# Patient Record
Sex: Male | Born: 1985
Health system: Southern US, Community
[De-identification: ages and names within clinical notes are randomized; demographics above are authoritative.]

---

## 2011-08-04 ENCOUNTER — Other Ambulatory Visit: Payer: Self-pay

## 2011-08-04 ENCOUNTER — Emergency Department (HOSPITAL_BASED_OUTPATIENT_CLINIC_OR_DEPARTMENT_OTHER)
Admission: EM | Admit: 2011-08-04 | Discharge: 2011-08-04 | Disposition: A | Discharge status: Home / Self Care | Payer: BC Managed Care – PPO | Attending: Emergency Medicine | Admitting: Emergency Medicine

## 2011-08-04 ENCOUNTER — Encounter: Payer: Self-pay | Admitting: Family Medicine

## 2011-08-04 ENCOUNTER — Emergency Department (INDEPENDENT_AMBULATORY_CARE_PROVIDER_SITE_OTHER): Payer: BC Managed Care – PPO

## 2011-08-04 DIAGNOSIS — R55 Syncope and collapse: Secondary | ICD-10-CM | POA: Insufficient documentation

## 2011-08-04 DIAGNOSIS — IMO0002 Reserved for concepts with insufficient information to code with codable children: Secondary | ICD-10-CM

## 2011-08-04 DIAGNOSIS — R0602 Shortness of breath: Secondary | ICD-10-CM

## 2011-08-04 DIAGNOSIS — E860 Dehydration: Secondary | ICD-10-CM

## 2011-08-04 DIAGNOSIS — R51 Headache: Secondary | ICD-10-CM

## 2011-08-04 LAB — DIFFERENTIAL
Basophils Relative: 0 % (ref 0–1)
Lymphocytes Relative: 28 % (ref 12–46)
Lymphs Abs: 2.3 10*3/uL (ref 0.7–4.0)
Monocytes Absolute: 0.6 10*3/uL (ref 0.1–1.0)
Monocytes Relative: 8 % (ref 3–12)
Neutro Abs: 5.3 10*3/uL (ref 1.7–7.7)
Neutrophils Relative %: 64 % (ref 43–77)

## 2011-08-04 LAB — URINALYSIS, ROUTINE W REFLEX MICROSCOPIC
Glucose, UA: NEGATIVE mg/dL
Ketones, ur: 40 mg/dL — AB
Leukocytes, UA: NEGATIVE
Nitrite: NEGATIVE
pH: 6 (ref 5.0–8.0)

## 2011-08-04 LAB — CBC
HCT: 44.5 % (ref 39.0–52.0)
Hemoglobin: 15.8 g/dL (ref 13.0–17.0)
MCHC: 35.5 g/dL (ref 30.0–36.0)
RBC: 5.48 MIL/uL (ref 4.22–5.81)

## 2011-08-04 LAB — RAPID URINE DRUG SCREEN, HOSP PERFORMED
Cocaine: NOT DETECTED
Opiates: NOT DETECTED
Tetrahydrocannabinol: NOT DETECTED

## 2011-08-04 LAB — LACTIC ACID, PLASMA: Lactic Acid, Venous: 1.1 mmol/L (ref 0.5–2.2)

## 2011-08-04 LAB — GLUCOSE, CAPILLARY: Glucose-Capillary: 82 mg/dL (ref 70–99)

## 2011-08-04 LAB — COMPREHENSIVE METABOLIC PANEL
Albumin: 5.1 g/dL (ref 3.5–5.2)
Alkaline Phosphatase: 88 U/L (ref 39–117)
BUN: 14 mg/dL (ref 6–23)
CO2: 24 mEq/L (ref 19–32)
Chloride: 100 mEq/L (ref 96–112)
Creatinine, Ser: 0.9 mg/dL (ref 0.50–1.35)
GFR calc Af Amer: >90 mL/min (ref >90)
GFR calc non Af Amer: >90 mL/min (ref >90)
Glucose, Bld: 85 mg/dL (ref 70–99)
Potassium: 3.5 mEq/L (ref 3.5–5.1)
Total Bilirubin: 1 mg/dL (ref 0.3–1.2)

## 2011-08-04 MED ORDER — SODIUM CHLORIDE 0.9 % IV SOLN
999.0000 mL | Freq: Once | INTRAVENOUS | Status: AC
  Administered 2011-08-04: 1000 mL via INTRAVENOUS

## 2011-08-04 NOTE — ED Notes (Signed)
Vital signs stable.Tolerating PO fluids well

## 2011-08-04 NOTE — ED Notes (Signed)
I took cbg reading and got 44 mg/dcltr. I notified nurse of same.

## 2011-08-04 NOTE — ED Notes (Signed)
Per father pt "crawled into house at approx 6pm" and stated that he "passed out in the yard". Per father, pt was initially disoriented. Pt currently alert and oriented. Pt c/o headache. Father sts pt has "not been eating much lately".

## 2011-08-04 NOTE — ED Notes (Signed)
Care plan reviewed with Pt and parent at side

## 2011-08-04 NOTE — ED Provider Notes (Signed)
History     CSN: [id number] Arrival date & time: 08/04/2011  6:52 PM   First MD Initiated Contact with Patient 08/04/11 1951      Chief Complaint  Patient presents with  . Loss of Consciousness    (Consider location/radiation/quality/duration/timing/severity/associated sxs/prior treatment) HPI The patient presents following an episode of loss of consciousness. He notes that prior to several days ago he was in his usual state of health. Over the past several days the patient has been anorexic, with decreased energy. He denies any focal changes during this time period. Today approximately 3 hours prior to presentation the patient had an episode of loss of consciousness with amnesia to the actual events. Upon reading of consciousness, the patient crawled into his house and alert his father that he had lost consciousness. On arrival the patient has no focal pain, no fever, no chills, no confusion, disorientation. He does note that he is fatigued. History reviewed. No pertinent past medical history.  History reviewed. No pertinent past surgical history.  No family history on file.  History  Substance Use Topics  . Smoking status: Never Smoker   . Smokeless tobacco: Not on file  . Alcohol Use: No      Review of Systems  All other systems reviewed and are negative.    Allergies  Penicillins  Home Medications  No current outpatient prescriptions on file.  BP 158/108  Pulse 107  Temp(Src) 98.1 F (36.7 C) (Oral)  Resp 24  SpO2 100%  Physical Exam  Constitutional: He is oriented to person, place, and time. He appears well-developed and well-nourished.  HENT:  Head: Normocephalic and atraumatic.  Eyes: Conjunctivae are normal. Pupils are equal, round, and reactive to light.  Neck: Neck supple.  Cardiovascular: Normal rate and regular rhythm.   Pulmonary/Chest: No respiratory distress.  Abdominal: Soft. There is no tenderness.  Musculoskeletal: He exhibits no edema.    Neurological: He is alert and oriented to person, place, and time.  Skin: Skin is warm and dry.  Psychiatric: He has a normal mood and affect.    ED Course  Procedures (including critical care time)   Labs Reviewed  GLUCOSE, CAPILLARY  CBC  DIFFERENTIAL  COMPREHENSIVE METABOLIC PANEL  URINE RAPID DRUG SCREEN (HOSP PERFORMED)  ETHANOL  LACTIC ACID, PLASMA  URINALYSIS, ROUTINE W REFLEX MICROSCOPIC   Ct Head Wo Contrast  08/04/2011  *RADIOLOGY REPORT*  Clinical Data: Blunt trauma post syncope, headache  CT HEAD WITHOUT CONTRAST  Technique:  Contiguous axial images were obtained from the base of the skull through the vertex without contrast.  Comparison: None.  Findings: There is fluid in the right mastoid air cells. There is no evidence of acute intracranial hemorrhage, brain edema, mass lesion, acute infarction,   mass effect, or midline shift. Acute infarct may be inapparent on noncontrast CT.  No other intra-axial abnormalities are seen, and the ventricles and sulci are within normal limits in size and symmetry.   No abnormal extra-axial fluid collections or masses are identified.  No significant calvarial abnormality.  IMPRESSION: 1. Negative for bleed or other acute intracranial process. 2. Fluid is present in the right mastoid air cells.  Original Report Authenticated By: Osa Craver, M.D.     No diagnosis found.  Results for orders placed during the hospital encounter of 08/04/11  GLUCOSE, CAPILLARY      Component Value Range   Glucose-Capillary 82  70 - 99 (mg/dL)  CBC      Component Value  Range   WBC 8.2  4.0 - 10.5 (K/uL)   RBC 5.48  4.22 - 5.81 (MIL/uL)   Hemoglobin 15.8  13.0 - 17.0 (g/dL)   HCT 91.4  78.2 - 95.6 (%)   MCV 81.2  78.0 - 100.0 (fL)   MCH 28.8  26.0 - 34.0 (pg)   MCHC 35.5  30.0 - 36.0 (g/dL)   RDW 21.3  08.6 - 57.8 (%)   Platelets 238  150 - 400 (K/uL)  DIFFERENTIAL      Component Value Range   Neutrophils Relative 64  43 - 77 (%)    Neutro Abs 5.3  1.7 - 7.7 (K/uL)   Lymphocytes Relative 28  12 - 46 (%)   Lymphs Abs 2.3  0.7 - 4.0 (K/uL)   Monocytes Relative 8  3 - 12 (%)   Monocytes Absolute 0.6  0.1 - 1.0 (K/uL)   Eosinophils Relative 1  0 - 5 (%)   Eosinophils Absolute 0.0  0.0 - 0.7 (K/uL)   Basophils Relative 0  0 - 1 (%)   Basophils Absolute 0.0  0.0 - 0.1 (K/uL)  COMPREHENSIVE METABOLIC PANEL      Component Value Range   Sodium 140  135 - 145 (mEq/L)   Potassium 3.5  3.5 - 5.1 (mEq/L)   Chloride 100  96 - 112 (mEq/L)   CO2 24  19 - 32 (mEq/L)   Glucose, Bld 85  70 - 99 (mg/dL)   BUN 14  6 - 23 (mg/dL)   Creatinine, Ser 4.69  0.50 - 1.35 (mg/dL)   Calcium 62.9  8.4 - 10.5 (mg/dL)   Total Protein 8.4 (*) 6.0 - 8.3 (g/dL)   Albumin 5.1  3.5 - 5.2 (g/dL)   AST 15  0 - 37 (U/L)   ALT 6  0 - 53 (U/L)   Alkaline Phosphatase 88  39 - 117 (U/L)   Total Bilirubin 1.0  0.3 - 1.2 (mg/dL)   GFR calc non Af Amer >90  >90 (mL/min)   GFR calc Af Amer >90  >90 (mL/min)  URINE RAPID DRUG SCREEN (HOSP PERFORMED)      Component Value Range   Opiates NONE DETECTED  NONE DETECTED    Cocaine NONE DETECTED  NONE DETECTED    Benzodiazepines NONE DETECTED  NONE DETECTED    Amphetamines NONE DETECTED  NONE DETECTED    Tetrahydrocannabinol NONE DETECTED  NONE DETECTED    Barbiturates NONE DETECTED  NONE DETECTED   ETHANOL      Component Value Range   Alcohol, Ethyl (B) <11  0 - 11 (mg/dL)  LACTIC ACID, PLASMA      Component Value Range   Lactic Acid, Venous 1.1  0.5 - 2.2 (mmol/L)  URINALYSIS, ROUTINE W REFLEX MICROSCOPIC      Component Value Range   Color, Urine AMBER (*) YELLOW    Appearance CLEAR  CLEAR    Specific Gravity, Urine 1.031 (*) 1.005 - 1.030    pH 6.0  5.0 - 8.0    Glucose, UA NEGATIVE  NEGATIVE (mg/dL)   Hgb urine dipstick NEGATIVE  NEGATIVE    Bilirubin Urine MODERATE (*) NEGATIVE    Ketones, ur 40 (*) NEGATIVE (mg/dL)   Protein, ur NEGATIVE  NEGATIVE (mg/dL)   Urobilinogen, UA 2.0 (*) 0.0 - 1.0  (mg/dL)   Nitrite NEGATIVE  NEGATIVE    Leukocytes, UA NEGATIVE  NEGATIVE    Dg Chest 2 View  08/04/2011  *RADIOLOGY REPORT*  Clinical Data:  Syncope, shortness of breath  CHEST - 2 VIEW  Comparison: None.  Findings: Lungs clear.  Heart size and pulmonary vascularity normal.  No effusion.  Visualized bones unremarkable.  IMPRESSION: No acute disease  Original Report Authenticated By: Osa Craver, M.D.   Ct Head Wo Contrast  08/04/2011  *RADIOLOGY REPORT*  Clinical Data: Blunt trauma post syncope, headache  CT HEAD WITHOUT CONTRAST  Technique:  Contiguous axial images were obtained from the base of the skull through the vertex without contrast.  Comparison: None.  Findings: There is fluid in the right mastoid air cells. There is no evidence of acute intracranial hemorrhage, brain edema, mass lesion, acute infarction,   mass effect, or midline shift. Acute infarct may be inapparent on noncontrast CT.  No other intra-axial abnormalities are seen, and the ventricles and sulci are within normal limits in size and symmetry.   No abnormal extra-axial fluid collections or masses are identified.  No significant calvarial abnormality.  IMPRESSION: 1. Negative for bleed or other acute intracranial process. 2. Fluid is present in the right mastoid air cells.  Original Report Authenticated By: Osa Craver, M.D.    Date: 08/04/2011  Rate:86   Rhythm: normal sinus rhythm  QRS Axis: right  Intervals: normal  ST/T Wave abnormalities: normal  Conduction Disutrbances:none  Narrative Interpretation:   Old EKG Reviewed: none available p wave suggestive of biatrial changes    MDM  This previously well young male presents with several days of fatigue, and one episode of possible loss of consciousness. On exam the patient is in no distress though he appears tired. Laboratory evaluation is notable for urinalysis abnormalities, ketonuria proteinuria, and bilirubin urine. The remainder of the  patient's labs, CAT scan, and x-ray are all within normal limits.  The patient's EKG is abnormal, though nonspecific. Given the absence of any chest pain, or dyspnea, the lack of dysrhythmia is reassuring. Given the absence of fever, the absence of distress, the reassuring labs, though the urine is suggestive of dehydration and catabolic state the patient will be discharged to follow up with his primary care physician. Symptoms suggestive of viral infection.        Gerhard Munch, MD 08/04/11 2220

## 2011-08-04 NOTE — ED Notes (Signed)
Family at bedside.Pt Tolerating PO fluids well

## 2014-03-21 ENCOUNTER — Encounter: Payer: Self-pay | Admitting: Family Medicine

## 2014-03-21 ENCOUNTER — Encounter (INDEPENDENT_AMBULATORY_CARE_PROVIDER_SITE_OTHER): Payer: Self-pay

## 2014-03-21 ENCOUNTER — Ambulatory Visit (INDEPENDENT_AMBULATORY_CARE_PROVIDER_SITE_OTHER): Payer: BC Managed Care – PPO | Admitting: Family Medicine

## 2014-03-21 VITALS — BP 146/99 | HR 77 | Temp 98.3°F | Ht 67.0 in | Wt 143.6 lb

## 2014-03-21 DIAGNOSIS — W57XXXA Bitten or stung by nonvenomous insect and other nonvenomous arthropods, initial encounter: Secondary | ICD-10-CM

## 2014-03-21 DIAGNOSIS — T148 Other injury of unspecified body region: Secondary | ICD-10-CM

## 2014-03-21 MED ORDER — DOXYCYCLINE HYCLATE 100 MG PO TABS: 100.0000 mg | ORAL_TABLET | Freq: Two times a day (BID) | ORAL | Status: DC

## 2014-03-21 NOTE — Progress Notes (Signed)
   Subjective:    Patient ID: Kevin Andrews, male    DOB: 03/23/1986, 28 y.o.   MRN: [id number]  HPI This 28 y.o. male presents for evaluation of tick bite left shoulder.   Review of Systems C/o tick bite   No chest pain, SOB, HA, dizziness, vision change, N/V, diarrhea, constipation, dysuria, urinary urgency or frequency, myalgias, arthralgias or rash.  Objective:   Physical Exam  Left shoulder with 4 cm erythema with central raising approx 1cm diameter       Assessment & Plan:  Tick bite - Plan: doxycycline (VIBRA-TABS) 100 MG tablet Po bid x10 days #20  Kevin Andrews Oxford FNP

## 2014-09-29 ENCOUNTER — Ambulatory Visit: Payer: BC Managed Care – PPO | Admitting: Family Medicine

## 2014-09-30 ENCOUNTER — Emergency Department (HOSPITAL_COMMUNITY): Payer: BLUE CROSS/BLUE SHIELD

## 2014-09-30 ENCOUNTER — Encounter (HOSPITAL_COMMUNITY): Payer: Self-pay | Admitting: Emergency Medicine

## 2014-09-30 ENCOUNTER — Emergency Department (HOSPITAL_COMMUNITY)
Admission: EM | Admit: 2014-09-30 | Discharge: 2014-09-30 | Disposition: A | Discharge status: Home / Self Care | Payer: BLUE CROSS/BLUE SHIELD | Attending: Emergency Medicine | Admitting: Emergency Medicine

## 2014-09-30 DIAGNOSIS — R42 Dizziness and giddiness: Secondary | ICD-10-CM | POA: Insufficient documentation

## 2014-09-30 DIAGNOSIS — Z880 Allergy status to penicillin: Secondary | ICD-10-CM | POA: Insufficient documentation

## 2014-09-30 DIAGNOSIS — I10 Essential (primary) hypertension: Secondary | ICD-10-CM | POA: Insufficient documentation

## 2014-09-30 DIAGNOSIS — Z79899 Other long term (current) drug therapy: Secondary | ICD-10-CM | POA: Diagnosis not present

## 2014-09-30 DIAGNOSIS — I160 Hypertensive urgency: Secondary | ICD-10-CM

## 2014-09-30 LAB — CBC WITH DIFFERENTIAL/PLATELET
BASOS PCT: 0 % (ref 0–1)
Basophils Absolute: 0 10*3/uL (ref 0.0–0.1)
Eosinophils Absolute: 0.1 10*3/uL (ref 0.0–0.7)
Eosinophils Relative: 1 % (ref 0–5)
HEMATOCRIT: 45.1 % (ref 39.0–52.0)
Hemoglobin: 15.7 g/dL (ref 13.0–17.0)
LYMPHS ABS: 1.6 10*3/uL (ref 0.7–4.0)
LYMPHS PCT: 23 % (ref 12–46)
MCH: 29 pg (ref 26.0–34.0)
MCHC: 34.8 g/dL (ref 30.0–36.0)
MCV: 83.2 fL (ref 78.0–100.0)
MONOS PCT: 6 % (ref 3–12)
Monocytes Absolute: 0.4 10*3/uL (ref 0.1–1.0)
NEUTROS ABS: 4.8 10*3/uL (ref 1.7–7.7)
Neutrophils Relative %: 70 % (ref 43–77)
PLATELETS: 200 10*3/uL (ref 150–400)
RBC: 5.42 MIL/uL (ref 4.22–5.81)
RDW: 12.4 % (ref 11.5–15.5)
WBC: 6.9 10*3/uL (ref 4.0–10.5)

## 2014-09-30 LAB — URINALYSIS, ROUTINE W REFLEX MICROSCOPIC
Bilirubin Urine: NEGATIVE
GLUCOSE, UA: NEGATIVE mg/dL
HGB URINE DIPSTICK: NEGATIVE
Ketones, ur: NEGATIVE mg/dL
Leukocytes, UA: NEGATIVE
NITRITE: NEGATIVE
PH: 7.5 (ref 5.0–8.0)
PROTEIN: NEGATIVE mg/dL
SPECIFIC GRAVITY, URINE: 1.012 (ref 1.005–1.030)
Urobilinogen, UA: 1 mg/dL (ref 0.0–1.0)

## 2014-09-30 LAB — COMPREHENSIVE METABOLIC PANEL
ALBUMIN: 4.5 g/dL (ref 3.5–5.2)
ALK PHOS: 97 U/L (ref 39–117)
ALT: 13 U/L (ref 0–53)
AST: 19 U/L (ref 0–37)
Anion gap: 10 (ref 5–15)
BILIRUBIN TOTAL: 0.6 mg/dL (ref 0.3–1.2)
BUN: 12 mg/dL (ref 6–23)
CHLORIDE: 105 meq/L (ref 96–112)
CO2: 24 mmol/L (ref 19–32)
CREATININE: 1.06 mg/dL (ref 0.50–1.35)
Calcium: 9.8 mg/dL (ref 8.4–10.5)
GFR calc Af Amer: >90 mL/min (ref >90)
Glucose, Bld: 107 mg/dL — ABNORMAL HIGH (ref 70–99)
POTASSIUM: 4 mmol/L (ref 3.5–5.1)
Sodium: 139 mmol/L (ref 135–145)
Total Protein: 7.7 g/dL (ref 6.0–8.3)

## 2014-09-30 LAB — CBG MONITORING, ED
Glucose-Capillary: 104 mg/dL — ABNORMAL HIGH (ref 70–99)
Glucose-Capillary: 92 mg/dL (ref 70–99)

## 2014-09-30 MED ORDER — LABETALOL HCL 5 MG/ML IV SOLN
10.0000 mg | Freq: Once | INTRAVENOUS | Status: AC
  Administered 2014-09-30: 10 mg via INTRAVENOUS
  Filled 2014-09-30: qty 4

## 2014-09-30 MED ORDER — METOPROLOL SUCCINATE ER 25 MG PO TB24: 25.0000 mg | ORAL_TABLET | Freq: Every day | ORAL | Status: DC

## 2014-09-30 NOTE — ED Notes (Signed)
Pt CGB 104. Informed Dr. Jeraldine LootsLockwood and RN

## 2014-09-30 NOTE — Discharge Instructions (Signed)
As discussed, it is important that you follow up as soon as possible with your physician for continued management of your condition. ° °If you develop any new, or concerning changes in your condition, please return to the emergency department immediately. ° °

## 2014-09-30 NOTE — ED Notes (Signed)
Pt. reports elevated blood pressure this evening 173/114 with dizziness this morning , denies nausea or vomitting , alert and oriented /speech clear , no facial asymmetry , equal grips with no arm drift.

## 2014-09-30 NOTE — ED Notes (Signed)
Family at bedside. 

## 2014-09-30 NOTE — ED Provider Notes (Signed)
CSN: [id number]     Arrival date & time 09/30/14  2017 History   First MD Initiated Contact with Patient 09/30/14 2039     Chief Complaint  Patient presents with  . Hypertension  . Dizziness     (Consider location/radiation/quality/duration/timing/severity/associated sxs/prior Treatment) HPI Patient presents with concern of dizziness, hypertension. Patient had mild dizziness last night before going to bed, but awoke today, approximately 14 hours ago, with unsteadiness, dizziness. Patient has had no nausea, vomiting, chest pain, dyspnea, speech changes, weakness in any extremity. However, the patient has a mild persistent dizziness. Patient checked his blood pressure several times, found to be elevated. Patient has previously been told of high blood pressure, though has no ongoing medication use.  History reviewed. No pertinent past medical history. History reviewed. No pertinent past surgical history. No family history on file. History  Substance Use Topics  . Smoking status: Never Smoker   . Smokeless tobacco: Not on file  . Alcohol Use: No    Review of Systems  Constitutional:       Per HPI, otherwise negative  HENT:       Per HPI, otherwise negative  Respiratory:       Per HPI, otherwise negative  Cardiovascular:       Per HPI, otherwise negative  Gastrointestinal: Negative for vomiting.  Endocrine:       Negative aside from HPI  Genitourinary:       Neg aside from HPI   Musculoskeletal:       Per HPI, otherwise negative  Skin: Negative.   Neurological: Positive for dizziness and light-headedness. Negative for tremors, seizures, syncope, facial asymmetry, speech difficulty, weakness, numbness and headaches.      Allergies  Penicillins  Home Medications   Prior to Admission medications   Medication Sig Start Date End Date Taking? Authorizing Provider  doxycycline (VIBRA-TABS) 100 MG tablet Take 1 tablet (100 mg total) by mouth 2 (two) times daily. 03/21/14    Anselm PancoastWilliam J Oxford, FNP   BP 171/116 mmHg  Pulse 106  Temp(Src) 98.7 F (37.1 C) (Oral)  Resp 20  SpO2 100% Physical Exam  Constitutional: He is oriented to person, place, and time. He appears well-developed. No distress.  HENT:  Head: Normocephalic and atraumatic.  Eyes: Conjunctivae and EOM are normal.  Cardiovascular: Normal rate and regular rhythm.   Pulmonary/Chest: Effort normal. No stridor. No respiratory distress.  Abdominal: He exhibits no distension.  Musculoskeletal: He exhibits no edema.  Neurological: He is alert and oriented to person, place, and time. He displays no atrophy and no tremor. No cranial nerve deficit or sensory deficit. He exhibits normal muscle tone. He displays no seizure activity. Coordination normal.  Skin: Skin is warm and dry.  Psychiatric: He has a normal mood and affect.  Nursing note and vitals reviewed.   ED Course  Procedures (including critical care time) Labs Review Labs Reviewed  COMPREHENSIVE METABOLIC PANEL - Abnormal; Notable for the following:    Glucose, Bld 107 (*)    All other components within normal limits  URINALYSIS, ROUTINE W REFLEX MICROSCOPIC - Abnormal; Notable for the following:    APPearance CLOUDY (*)    All other components within normal limits  CBG MONITORING, ED - Abnormal; Notable for the following:    Glucose-Capillary 104 (*)    All other components within normal limits  CBC WITH DIFFERENTIAL  CBG MONITORING, ED    Imaging Review Ct Head (brain) Wo Contrast  09/30/2014   CLINICAL DATA:  Dizziness, hypertension.  EXAM: CT HEAD WITHOUT CONTRAST  TECHNIQUE: Contiguous axial images were obtained from the base of the skull through the vertex without intravenous contrast.  COMPARISON:  08/04/2011  FINDINGS: No intracranial hemorrhage, mass effect, or midline shift. No hydrocephalus. The basilar cisterns are patent. No evidence of territorial infarct. No intracranial fluid collection. Calvarium is intact. These mucosal  thickening involving the ethmoid air cells, right greater than left and right frontal sinus. The previous opacification of right mastoid air cells has resolved.  IMPRESSION: 1. No acute intracranial abnormality. 2. Sinusitis.   Electronically Signed   By: Rubye Oaks M.D.   On: 09/30/2014 21:53     EKG Interpretation   Date/Time:  Tuesday September 30 2014 21:05:30 EST Ventricular Rate:  105 PR Interval:  200 QRS Duration: 87 QT Interval:  336 QTC Calculation: 444 R Axis:   92 Text Interpretation:  Age not entered, assumed to be  29 years old for  purpose of ECG interpretation Sinus tachycardia Right atrial enlargement  Borderline right axis deviation Sinus tachycardia Abnormal ekg Confirmed  by Gerhard Munch  MD ([id number]) on 09/30/2014 9:11:48 PM     11:32 PM Patient's symptoms have improved somewhat. Blood pressure has improved substantially, to a near normal range. He, his wife and I had a very lengthy conversation about hypertension, this episode of dizziness, the need for new medication, primary care and neurology follow-up.  MDM   Patient presents with hypertensive urgency as well as dizziness. No nausea, no vomiting, no neurologic deficits on exam.  Patient is awake, alert, interacting appropriately throughout hours of monitoring in the emergency department. Patient's evaluation is largely reassuring, his blood pressure decreases, and his symptoms improved. There is low suspicion for cerebellar infarct, nor other acute ischemic pathology given the absence of distress, the absence of neurologic findings, and the patient's absence of risk factors. However, given the patient's hypertension, at a relatively young age, the patient will follow-up with primary care, neurology for further evaluation and management.    Gerhard Munch, MD 09/30/14 [id number]

## 2014-09-30 NOTE — ED Notes (Signed)
Patient transported to CT 

## 2014-11-03 ENCOUNTER — Telehealth: Payer: Self-pay | Admitting: Family Medicine

## 2014-11-03 MED ORDER — METOPROLOL SUCCINATE ER 25 MG PO TB24: 25.0000 mg | ORAL_TABLET | Freq: Every day | ORAL | Status: DC

## 2014-11-03 NOTE — Telephone Encounter (Signed)
Appt scheduled with Dr Darlyn ReadStacks for 11/10/14. Patient is doing well on Metoprolol. Med refilled.

## 2014-11-10 ENCOUNTER — Telehealth: Payer: Self-pay | Admitting: *Deleted

## 2014-11-10 ENCOUNTER — Ambulatory Visit: Payer: Self-pay | Admitting: Family Medicine

## 2014-12-06 ENCOUNTER — Other Ambulatory Visit: Payer: Self-pay | Admitting: Family Medicine

## 2014-12-08 ENCOUNTER — Other Ambulatory Visit: Payer: Self-pay | Admitting: Family Medicine

## 2014-12-08 MED ORDER — METOPROLOL SUCCINATE ER 25 MG PO TB24: 25.0000 mg | ORAL_TABLET | Freq: Every day | ORAL | Status: DC

## 2014-12-08 NOTE — Telephone Encounter (Signed)
Detailed message left and rx sent to pharmacy

## 2014-12-25 ENCOUNTER — Encounter: Payer: Self-pay | Admitting: Family Medicine

## 2014-12-25 ENCOUNTER — Ambulatory Visit (INDEPENDENT_AMBULATORY_CARE_PROVIDER_SITE_OTHER): Payer: BLUE CROSS/BLUE SHIELD | Admitting: Family Medicine

## 2014-12-25 VITALS — BP 121/81 | HR 68 | Temp 97.4°F | Ht 67.0 in | Wt 149.8 lb

## 2014-12-25 DIAGNOSIS — I10 Essential (primary) hypertension: Secondary | ICD-10-CM | POA: Diagnosis not present

## 2014-12-25 NOTE — Progress Notes (Signed)
Subjective:  Patient ID: Kevin Andrews, male    DOB: [birth year]  Age: 29 y.o. MRN: [id number]  CC: Hypertension and chalazion   HPI Kevin Andrews presents for follow-up of hypertension. Patient has no history of headache chest pain or shortness of breath or recent cough. Patient also denies symptoms of TIA such as numbness weakness lateralizing. Patient checks  blood pressure at home and has not had any elevated readings recently. Patient denies side effects from his medication. States taking it regularly.   History Kevin Andrews has no past medical history on file.   He has no past surgical history on file.   His family history is not on file.He reports that he has never smoked. He does not have any smokeless tobacco history on file. He reports that he does not drink alcohol or use illicit drugs.  Current Outpatient Prescriptions on File Prior to Visit  Medication Sig Dispense Refill  . metoprolol succinate (TOPROL-XL) 25 MG 24 hr tablet Take 1 tablet (25 mg total) by mouth daily. 30 tablet 0   No current facility-administered medications on file prior to visit.    ROS Review of Systems  Constitutional: Negative for fever, chills, diaphoresis and unexpected weight change.  HENT: Negative for congestion, hearing loss, rhinorrhea, sore throat and trouble swallowing.   Respiratory: Negative for cough, chest tightness, shortness of breath and wheezing.   Gastrointestinal: Negative for nausea, vomiting, abdominal pain, diarrhea, constipation and abdominal distention.  Endocrine: Negative for cold intolerance and heat intolerance.  Genitourinary: Negative for dysuria, hematuria and flank pain.  Musculoskeletal: Negative for joint swelling and arthralgias.  Skin: Negative for rash.  Neurological: Negative for dizziness and headaches.  Psychiatric/Behavioral: Negative for dysphoric mood, decreased concentration and agitation. The patient is not nervous/anxious.     Objective:  BP  121/81 mmHg  Pulse 68  Temp(Src) 97.4 F (36.3 C) (Oral)  Ht  (1.702 m)  Wt 149 lb 12.8 oz (67.949 kg)  BMI 23.46 kg/m2  BP Readings from Last 3 Encounters:  12/25/14 121/81  09/30/14 141/94  03/21/14 146/99    Wt Readings from Last 3 Encounters:  12/25/14 149 lb 12.8 oz (67.949 kg)  03/21/14 143 lb 9.6 oz (65.137 kg)     Physical Exam  Constitutional: He is oriented to person, place, and time. He appears well-developed and well-nourished. No distress.  HENT:  Head: Normocephalic and atraumatic.  Right Ear: External ear normal.  Left Ear: External ear normal.  Nose: Nose normal.  Mouth/Throat: Oropharynx is clear and moist.  Eyes: Conjunctivae and EOM are normal. Pupils are equal, round, and reactive to light.  Neck: Normal range of motion. Neck supple. No thyromegaly present.  Cardiovascular: Normal rate, regular rhythm and normal heart sounds.   No murmur heard. Pulmonary/Chest: Effort normal and breath sounds normal. No respiratory distress. He has no wheezes. He has no rales.  Abdominal: Soft. Bowel sounds are normal. He exhibits no distension. There is no tenderness.  Lymphadenopathy:    He has no cervical adenopathy.  Neurological: He is alert and oriented to person, place, and time. He has normal reflexes.  Skin: Skin is warm and dry.  Psychiatric: He has a normal mood and affect. His behavior is normal. Judgment and thought content normal.    No results found for: HGBA1C  Lab Results  Component Value Date   WBC 6.9 09/30/2014   HGB 15.7 09/30/2014   HCT 45.1 09/30/2014   PLT 200 09/30/2014   GLUCOSE 107* 09/30/2014  ALT 13 09/30/2014   AST 19 09/30/2014   NA 139 09/30/2014   K 4.0 09/30/2014   CL 105 09/30/2014   CREATININE 1.06 09/30/2014   BUN 12 09/30/2014   CO2 24 09/30/2014    Ct Head (brain) Wo Contrast  09/30/2014   CLINICAL DATA:  Dizziness, hypertension.  EXAM: CT HEAD WITHOUT CONTRAST  TECHNIQUE: Contiguous axial images were obtained  from the base of the skull through the vertex without intravenous contrast.  COMPARISON:  08/04/2011  FINDINGS: No intracranial hemorrhage, mass effect, or midline shift. No hydrocephalus. The basilar cisterns are patent. No evidence of territorial infarct. No intracranial fluid collection. Calvarium is intact. These mucosal thickening involving the ethmoid air cells, right greater than left and right frontal sinus. The previous opacification of right mastoid air cells has resolved.  IMPRESSION: 1. No acute intracranial abnormality. 2. Sinusitis.   Electronically Signed   By: Rubye OaksMelanie  Ehinger M.D.   On: 09/30/2014 21:53    Assessment & Plan:   Kevin Andrews was seen today for hypertension and chalazion.  Diagnoses and all orders for this visit:  Essential hypertension, benign  I am having Kevin Andrews maintain his metoprolol succinate.  No orders of the defined types were placed in this encounter.     Follow-up: No Follow-up on file.  Kevin Andrews, M.D.

## 2014-12-25 NOTE — Patient Instructions (Signed)
DASH Eating Plan °DASH stands for "Dietary Approaches to Stop Hypertension." The DASH eating plan is a healthy eating plan that has been shown to reduce high blood pressure (hypertension). Additional health benefits may include reducing the risk of type 2 diabetes mellitus, heart disease, and stroke. The DASH eating plan may also help with weight loss. °WHAT DO I NEED TO KNOW ABOUT THE DASH EATING PLAN? °For the DASH eating plan, you will follow these general guidelines: °· Choose foods with a percent daily value for sodium of less than 5% (as listed on the food label). °· Use salt-free seasonings or herbs instead of table salt or sea salt. °· Check with your health care provider or pharmacist before using salt substitutes. °· Eat lower-sodium products, often labeled as "lower sodium" or "no salt added." °· Eat fresh foods. °· Eat more vegetables, fruits, and low-fat dairy products. °· Choose whole grains. Look for the word "whole" as the first word in the ingredient list. °· Choose fish and skinless chicken or turkey more often than red meat. Limit fish, poultry, and meat to 6 oz (170 g) each day. °· Limit sweets, desserts, sugars, and sugary drinks. °· Choose heart-healthy fats. °· Limit cheese to 1 oz (28 g) per day. °· Eat more home-cooked food and less restaurant, buffet, and fast food. °· Limit fried foods. °· Cook foods using methods other than frying. °· Limit canned vegetables. If you do use them, rinse them well to decrease the sodium. °· When eating at a restaurant, ask that your food be prepared with less salt, or no salt if possible. °WHAT FOODS CAN I EAT? °Seek help from a dietitian for individual calorie needs. °Grains °Whole grain or whole wheat bread. Brown rice. Whole grain or whole wheat pasta. Quinoa, bulgur, and whole grain cereals. Low-sodium cereals. Corn or whole wheat flour tortillas. Whole grain cornbread. Whole grain crackers. Low-sodium crackers. °Vegetables °Fresh or frozen vegetables  (raw, steamed, roasted, or grilled). Low-sodium or reduced-sodium tomato and vegetable juices. Low-sodium or reduced-sodium tomato sauce and paste. Low-sodium or reduced-sodium canned vegetables.  °Fruits °All fresh, canned (in natural juice), or frozen fruits. °Meat and Other Protein Products °Ground beef (85% or leaner), grass-fed beef, or beef trimmed of fat. Skinless chicken or turkey. Ground chicken or turkey. Pork trimmed of fat. All fish and seafood. Eggs. Dried beans, peas, or lentils. Unsalted nuts and seeds. Unsalted canned beans. °Dairy °Low-fat dairy products, such as skim or 1% milk, 2% or reduced-fat cheeses, low-fat ricotta or cottage cheese, or plain low-fat yogurt. Low-sodium or reduced-sodium cheeses. °Fats and Oils °Tub margarines without trans fats. Light or reduced-fat mayonnaise and salad dressings (reduced sodium). Avocado. Safflower, olive, or canola oils. Natural peanut or almond butter. °Other °Unsalted popcorn and pretzels. °The items listed above may not be a complete list of recommended foods or beverages. Contact your dietitian for more options. °WHAT FOODS ARE NOT RECOMMENDED? °Grains °White bread. White pasta. White rice. Refined cornbread. Bagels and croissants. Crackers that contain trans fat. °Vegetables °Creamed or fried vegetables. Vegetables in a cheese sauce. Regular canned vegetables. Regular canned tomato sauce and paste. Regular tomato and vegetable juices. °Fruits °Dried fruits. Canned fruit in light or heavy syrup. Fruit juice. °Meat and Other Protein Products °Fatty cuts of meat. Ribs, chicken wings, bacon, sausage, bologna, salami, chitterlings, fatback, hot dogs, bratwurst, and packaged luncheon meats. Salted nuts and seeds. Canned beans with salt. °Dairy °Whole or 2% milk, cream, half-and-half, and cream cheese. Whole-fat or sweetened yogurt. Full-fat   cheeses or blue cheese. Nondairy creamers and whipped toppings. Processed cheese, cheese spreads, or cheese  curds. °Condiments °Onion and garlic salt, seasoned salt, table salt, and sea salt. Canned and packaged gravies. Worcestershire sauce. Tartar sauce. Barbecue sauce. Teriyaki sauce. Soy sauce, including reduced sodium. Steak sauce. Fish sauce. Oyster sauce. Cocktail sauce. Horseradish. Ketchup and mustard. Meat flavorings and tenderizers. Bouillon cubes. Hot sauce. Tabasco sauce. Marinades. Taco seasonings. Relishes. °Fats and Oils °Butter, stick margarine, lard, shortening, ghee, and bacon fat. Coconut, palm kernel, or palm oils. Regular salad dressings. °Other °Pickles and olives. Salted popcorn and pretzels. °The items listed above may not be a complete list of foods and beverages to avoid. Contact your dietitian for more information. °WHERE CAN I FIND MORE INFORMATION? °National [employer], [employer], [employer]: www.nhlbi.nih.gov/health/health-topics/topics/dash/ °Document Released: 09/01/2011 Document Revised: 01/27/2014 Document Reviewed: 07/17/2013 °ExitCare® Patient Information ©2015 [employer]. This information is not intended to replace advice given to you by your health care provider. Make sure you discuss any questions you have with your health care provider. ° °

## 2015-01-10 ENCOUNTER — Other Ambulatory Visit: Payer: Self-pay | Admitting: Family Medicine

## 2015-01-12 ENCOUNTER — Other Ambulatory Visit: Payer: Self-pay | Admitting: Family Medicine

## 2015-01-12 NOTE — Telephone Encounter (Signed)
done

## 2015-04-17 ENCOUNTER — Encounter: Payer: Self-pay | Admitting: *Deleted

## 2015-06-24 ENCOUNTER — Encounter: Payer: BLUE CROSS/BLUE SHIELD | Admitting: Family Medicine

## 2015-06-25 ENCOUNTER — Encounter: Payer: Self-pay | Admitting: Family Medicine

## 2015-06-30 ENCOUNTER — Encounter: Payer: BLUE CROSS/BLUE SHIELD | Admitting: Family Medicine

## 2015-07-21 ENCOUNTER — Other Ambulatory Visit: Payer: Self-pay | Admitting: Family Medicine

## 2015-07-21 ENCOUNTER — Encounter: Payer: Self-pay | Admitting: Family Medicine

## 2015-07-21 ENCOUNTER — Ambulatory Visit (INDEPENDENT_AMBULATORY_CARE_PROVIDER_SITE_OTHER): Payer: BLUE CROSS/BLUE SHIELD | Admitting: Family Medicine

## 2015-07-21 VITALS — BP 131/90 | HR 70 | Temp 97.4°F | Ht 67.0 in | Wt 152.4 lb

## 2015-07-21 DIAGNOSIS — I10 Essential (primary) hypertension: Secondary | ICD-10-CM | POA: Diagnosis not present

## 2015-07-21 DIAGNOSIS — Z0000 Encounter for general adult medical examination without abnormal findings: Secondary | ICD-10-CM

## 2015-07-21 DIAGNOSIS — R1013 Epigastric pain: Secondary | ICD-10-CM

## 2015-07-21 LAB — POCT UA - MICROSCOPIC ONLY
Bacteria, U Microscopic: NEGATIVE
CASTS, UR, LPF, POC: NEGATIVE
Crystals, Ur, HPF, POC: NEGATIVE
Epithelial cells, urine per micros: NEGATIVE
RBC, urine, microscopic: NEGATIVE
WBC, Ur, HPF, POC: NEGATIVE
YEAST UA: NEGATIVE

## 2015-07-21 LAB — POCT URINALYSIS DIPSTICK
BILIRUBIN UA: NEGATIVE
Glucose, UA: NEGATIVE
Ketones, UA: NEGATIVE
Leukocytes, UA: NEGATIVE
Nitrite, UA: NEGATIVE
PH UA: 5
Protein, UA: NEGATIVE
RBC UA: NEGATIVE
SPEC GRAV UA: 1.02
Urobilinogen, UA: NEGATIVE

## 2015-07-21 MED ORDER — PANTOPRAZOLE SODIUM 40 MG PO TBEC: 40.0000 mg | DELAYED_RELEASE_TABLET | Freq: Every day | ORAL | Status: DC

## 2015-07-21 NOTE — Progress Notes (Signed)
Subjective:  Patient ID: Kevin Andrews, male    DOB: 1986/09/20  Age: 29 y.o. MRN: [id number]  CC: Annual Exam   HPI [name] presents for CPE. He reports an episode for 5 months ago where he had a funny feeling in his chest and went to an urgent care and had an EKG and it was normal. The symptoms have not recurred. However he does have to use Tums fairly frequently for indigestion. He is not sure it's indigestion he just knows it's an uncomfortable feeling in his chest but it does go away when he uses Tums almost instantly. He notes that he has some bloating after meals in the evening. He mentions spaghetti and [place] bread and salad last night and had some bloating. That doesn't last very long just a few hours. And it's not every night.  History Hatcher has no past medical history on file.   He has no past surgical history on file.   His family history is not on file.He reports that he has never smoked. He does not have any smokeless tobacco history on file. He reports that he does not drink alcohol or use illicit drugs.  Outpatient Prescriptions Prior to Visit  Medication Sig Dispense Refill  . metoprolol succinate (TOPROL-XL) 25 MG 24 hr tablet TAKE 1 TABLET BY MOUTH DAILY 30 tablet 5   No facility-administered medications prior to visit.    ROS Review of Systems  Constitutional: Negative for fever, chills, diaphoresis, activity change, appetite change, fatigue and unexpected weight change.  HENT: Negative for congestion, ear pain, hearing loss, postnasal drip, rhinorrhea, sore throat, tinnitus and trouble swallowing.   Eyes: Negative for photophobia, pain, discharge and redness.  Respiratory: Negative for apnea, cough, choking, chest tightness, shortness of breath, wheezing and stridor.   Cardiovascular: Negative for chest pain, palpitations and leg swelling.  Gastrointestinal: Positive for abdominal distention. Negative for nausea, vomiting, abdominal pain, diarrhea,  constipation and blood in stool.  Endocrine: Negative for cold intolerance, heat intolerance, polydipsia, polyphagia and polyuria.  Genitourinary: Negative for dysuria, urgency, frequency, hematuria, flank pain, enuresis, difficulty urinating and genital sores.  Musculoskeletal: Negative for joint swelling and arthralgias.  Skin: Negative for color change, rash and wound.  Allergic/Immunologic: Negative for immunocompromised state.  Neurological: Negative for dizziness, tremors, seizures, syncope, facial asymmetry, speech difficulty, weakness, light-headedness, numbness and headaches.  Hematological: Does not bruise/bleed easily.  Psychiatric/Behavioral: Negative for suicidal ideas, hallucinations, behavioral problems, confusion, sleep disturbance, dysphoric mood, decreased concentration and agitation. The patient is not nervous/anxious and is not hyperactive.     Objective:  BP 131/90 mmHg  Pulse 70  Temp(Src) 97.4 F (36.3 C) (Oral)  Ht '5\' 7"'  (1.702 m)  Wt 152 lb 6.4 oz (69.128 kg)  BMI 23.86 kg/m2  BP Readings from Last 3 Encounters:  07/21/15 131/90  12/25/14 121/81  09/30/14 141/94    Wt Readings from Last 3 Encounters:  07/21/15 152 lb 6.4 oz (69.128 kg)  12/25/14 149 lb 12.8 oz (67.949 kg)  03/21/14 143 lb 9.6 oz (65.137 kg)     Physical Exam  Constitutional: He is oriented to person, place, and time. He appears well-developed and well-nourished.  HENT:  Head: Normocephalic and atraumatic.  Mouth/Throat: Oropharynx is clear and moist.  Eyes: EOM are normal. Pupils are equal, round, and reactive to light.  Neck: Normal range of motion. No tracheal deviation present. No thyromegaly present.  Cardiovascular: Normal rate, regular rhythm and normal heart sounds.  Exam reveals no gallop and  no friction rub.   No murmur heard. Pulmonary/Chest: Breath sounds normal. He has no wheezes. He has no rales.  Abdominal: Soft. He exhibits no mass. There is no tenderness.    Musculoskeletal: Normal range of motion. He exhibits no edema.  Neurological: He is alert and oriented to person, place, and time.  Skin: Skin is warm and dry.  Psychiatric: He has a normal mood and affect.    No results found for: HGBA1C  Lab Results  Component Value Date   WBC 6.9 09/30/2014   HGB 15.7 09/30/2014   HCT 45.1 09/30/2014   PLT 200 09/30/2014   GLUCOSE 107* 09/30/2014   ALT 13 09/30/2014   AST 19 09/30/2014   NA 139 09/30/2014   K 4.0 09/30/2014   CL 105 09/30/2014   CREATININE 1.06 09/30/2014   BUN 12 09/30/2014   CO2 24 09/30/2014    Ct Head (brain) Wo Contrast  09/30/2014  CLINICAL DATA:  Dizziness, hypertension. EXAM: CT HEAD WITHOUT CONTRAST TECHNIQUE: Contiguous axial images were obtained from the base of the skull through the vertex without intravenous contrast. COMPARISON:  08/04/2011 FINDINGS: No intracranial hemorrhage, mass effect, or midline shift. No hydrocephalus. The basilar cisterns are patent. No evidence of territorial infarct. No intracranial fluid collection. Calvarium is intact. These mucosal thickening involving the ethmoid air cells, right greater than left and right frontal sinus. The previous opacification of right mastoid air cells has resolved. IMPRESSION: 1. No acute intracranial abnormality. 2. Sinusitis. Electronically Signed   By: Jeb Levering M.D.   On: 09/30/2014 21:53    Assessment & Plan:   Elijan was seen today for annual exam.  Diagnoses and all orders for this visit:  Essential hypertension, benign -     CBC with Differential/Platelet -     CMP14+EGFR -     Lipid panel -     POCT UA - Microscopic Only -     POCT urinalysis dipstick  Wellness examination -     CBC with Differential/Platelet -     CMP14+EGFR -     Lipid panel -     POCT UA - Microscopic Only -     POCT urinalysis dipstick  Dyspepsia  Other orders -     pantoprazole (PROTONIX) 40 MG tablet; Take 1 tablet (40 mg total) by mouth daily. For  stomach   I am having Mr. Sapia start on pantoprazole. I am also having him maintain his metoprolol succinate.  Meds ordered this encounter  Medications  . pantoprazole (PROTONIX) 40 MG tablet    Sig: Take 1 tablet (40 mg total) by mouth daily. For stomach    Dispense:  30 tablet    Refill:  5     Follow-up: Return in about 6 months (around 01/19/2016) for hypertension.  Claretta Fraise, M.D.

## 2015-07-22 ENCOUNTER — Telehealth: Payer: Self-pay | Admitting: Family Medicine

## 2015-07-22 LAB — LIPID PANEL
CHOLESTEROL TOTAL: 154 mg/dL (ref 100–199)
Chol/HDL Ratio: 3.3 ratio units (ref 0.0–5.0)
HDL: 46 mg/dL (ref >39)
LDL Calculated: 93 mg/dL (ref 0–99)
Triglycerides: 73 mg/dL (ref 0–149)
VLDL Cholesterol Cal: 15 mg/dL (ref 5–40)

## 2015-07-22 LAB — CBC WITH DIFFERENTIAL/PLATELET
BASOS: 0 %
Basophils Absolute: 0 10*3/uL (ref 0.0–0.2)
EOS (ABSOLUTE): 0.2 10*3/uL (ref 0.0–0.4)
EOS: 3 %
HEMATOCRIT: 45.1 % (ref 37.5–51.0)
Hemoglobin: 15.3 g/dL (ref 12.6–17.7)
Immature Grans (Abs): 0 10*3/uL (ref 0.0–0.1)
Immature Granulocytes: 0 %
Lymphocytes Absolute: 1.4 10*3/uL (ref 0.7–3.1)
Lymphs: 25 %
MCH: 28.9 pg (ref 26.6–33.0)
MCHC: 33.9 g/dL (ref 31.5–35.7)
MCV: 85 fL (ref 79–97)
Monocytes Absolute: 0.6 10*3/uL (ref 0.1–0.9)
Monocytes: 11 %
NEUTROS ABS: 3.6 10*3/uL (ref 1.4–7.0)
Neutrophils: 61 %
Platelets: 199 10*3/uL (ref 150–379)
RBC: 5.3 x10E6/uL (ref 4.14–5.80)
RDW: 13.6 % (ref 12.3–15.4)
WBC: 5.9 10*3/uL (ref 3.4–10.8)

## 2015-07-22 LAB — CMP14+EGFR
ALBUMIN: 4.5 g/dL (ref 3.5–5.5)
ALK PHOS: 94 IU/L (ref 39–117)
ALT: 12 IU/L (ref 0–44)
AST: 17 IU/L (ref 0–40)
Albumin/Globulin Ratio: 1.8 (ref 1.1–2.5)
BILIRUBIN TOTAL: 0.5 mg/dL (ref 0.0–1.2)
BUN / CREAT RATIO: 11 (ref 8–19)
BUN: 11 mg/dL (ref 6–20)
CHLORIDE: 98 mmol/L (ref 97–106)
CO2: 25 mmol/L (ref 18–29)
Calcium: 9.7 mg/dL (ref 8.7–10.2)
Creatinine, Ser: 1.01 mg/dL (ref 0.76–1.27)
GFR calc non Af Amer: 101 mL/min/{1.73_m2} (ref >59)
GFR, EST AFRICAN AMERICAN: 116 mL/min/{1.73_m2} (ref >59)
GLOBULIN, TOTAL: 2.5 g/dL (ref 1.5–4.5)
Glucose: 86 mg/dL (ref 65–99)
Potassium: 3.9 mmol/L (ref 3.5–5.2)
SODIUM: 140 mmol/L (ref 136–144)
Total Protein: 7 g/dL (ref 6.0–8.5)

## 2015-07-22 NOTE — Telephone Encounter (Signed)
Done this am 

## 2015-10-26 ENCOUNTER — Ambulatory Visit (INDEPENDENT_AMBULATORY_CARE_PROVIDER_SITE_OTHER): Payer: BLUE CROSS/BLUE SHIELD | Admitting: *Deleted

## 2015-10-26 DIAGNOSIS — Z23 Encounter for immunization: Secondary | ICD-10-CM

## 2015-10-26 NOTE — Patient Instructions (Signed)
Tdap Vaccine (Tetanus, Diphtheria and Pertussis): What You Need to Know 1. Why get vaccinated? Tetanus, diphtheria and pertussis are very serious diseases. Tdap vaccine can protect us from these diseases. And, Tdap vaccine given to pregnant women can protect newborn babies against pertussis. TETANUS (Lockjaw) is rare in the [place] today. It causes painful muscle tightening and stiffness, usually all over the body.  It can lead to tightening of muscles in the head and neck so you can't open your mouth, swallow, or sometimes even breathe. Tetanus kills about 1 out of 10 people who are infected even after receiving the best medical care. DIPHTHERIA is also rare in the [place] today. It can cause a thick coating to form in the back of the throat.  It can lead to breathing problems, heart failure, paralysis, and death. PERTUSSIS (Whooping Cough) causes severe coughing spells, which can cause difficulty breathing, vomiting and disturbed sleep.  It can also lead to weight loss, incontinence, and rib fractures. Up to 2 in 100 adolescents and 5 in 100 adults with pertussis are hospitalized or have complications, which could include pneumonia or death. These diseases are caused by bacteria. Diphtheria and pertussis are spread from person to person through secretions from coughing or sneezing. Tetanus enters the body through cuts, scratches, or wounds. Before vaccines, as many as 200,000 cases of diphtheria, 200,000 cases of pertussis, and hundreds of cases of tetanus, were reported in the [place] each year. Since vaccination began, reports of cases for tetanus and diphtheria have dropped by about 99% and for pertussis by about 80%. 2. Tdap vaccine Tdap vaccine can protect adolescents and adults from tetanus, diphtheria, and pertussis. One dose of Tdap is routinely given at age 11 or 12. People who did not get Tdap at that age should get it as soon as possible. Tdap is especially important  for healthcare professionals and anyone having close contact with a baby younger than 12 months. Pregnant women should get a dose of Tdap during every pregnancy, to protect the newborn from pertussis. Infants are most at risk for severe, life-threatening complications from pertussis. Another vaccine, called Td, protects against tetanus and diphtheria, but not pertussis. A Td booster should be given every 10 years. Tdap may be given as one of these boosters if you have never gotten Tdap before. Tdap may also be given after a severe cut or burn to prevent tetanus infection. Your doctor or the person giving you the vaccine can give you more information. Tdap may safely be given at the same time as other vaccines. 3. Some people should not get this vaccine  A person who has ever had a life-threatening allergic reaction after a previous dose of any diphtheria, tetanus or pertussis containing vaccine, OR has a severe allergy to any part of this vaccine, should not get Tdap vaccine. Tell the person giving the vaccine about any severe allergies.  Anyone who had coma or long repeated seizures within 7 days after a childhood dose of DTP or DTaP, or a previous dose of Tdap, should not get Tdap, unless a cause other than the vaccine was found. They can still get Td.  Talk to your doctor if you:  have seizures or another nervous system problem,  had severe pain or swelling after any vaccine containing diphtheria, tetanus or pertussis,  ever had a condition called Guillain-Barr Syndrome (GBS),  aren't feeling well on the day the shot is scheduled. 4. Risks With any medicine, including vaccines, there is   a chance of side effects. These are usually mild and go away on their own. Serious reactions are also possible but are rare. Most people who get Tdap vaccine do not have any problems with it. Mild problems following Tdap (Did not interfere with activities)  Pain where the shot was given (about 3 in 4  adolescents or 2 in 3 adults)  Redness or swelling where the shot was given (about 1 person in 5)  Mild fever of at least 100.4F (up to about 1 in 25 adolescents or 1 in 100 adults)  Headache (about 3 or 4 people in 10)  Tiredness (about 1 person in 3 or 4)  Nausea, vomiting, diarrhea, stomach ache (up to 1 in 4 adolescents or 1 in 10 adults)  Chills, sore joints (about 1 person in 10)  Body aches (about 1 person in 3 or 4)  Rash, swollen glands (uncommon) Moderate problems following Tdap (Interfered with activities, but did not require medical attention)  Pain where the shot was given (up to 1 in 5 or 6)  Redness or swelling where the shot was given (up to about 1 in 16 adolescents or 1 in 12 adults)  Fever over 102F (about 1 in 100 adolescents or 1 in 250 adults)  Headache (about 1 in 7 adolescents or 1 in 10 adults)  Nausea, vomiting, diarrhea, stomach ache (up to 1 or 3 people in 100)  Swelling of the entire arm where the shot was given (up to about 1 in 500). Severe problems following Tdap (Unable to perform usual activities; required medical attention)  Swelling, severe pain, bleeding and redness in the arm where the shot was given (rare). Problems that could happen after any vaccine:  People sometimes faint after a medical procedure, including vaccination. Sitting or lying down for about 15 minutes can help prevent fainting, and injuries caused by a fall. Tell your doctor if you feel dizzy, or have vision changes or ringing in the ears.  Some people get severe pain in the shoulder and have difficulty moving the arm where a shot was given. This happens very rarely.  Any medication can cause a severe allergic reaction. Such reactions from a vaccine are very rare, estimated at fewer than 1 in a million doses, and would happen within a few minutes to a few hours after the vaccination. As with any medicine, there is a very remote chance of a vaccine causing a serious  injury or death. The safety of vaccines is always being monitored. For more information, visit: www.cdc.gov/vaccinesafety/ 5. What if there is a serious problem? What should I look for?  Look for anything that concerns you, such as signs of a severe allergic reaction, very high fever, or unusual behavior.  Signs of a severe allergic reaction can include hives, swelling of the face and throat, difficulty breathing, a fast heartbeat, dizziness, and weakness. These would usually start a few minutes to a few hours after the vaccination. What should I do?  If you think it is a severe allergic reaction or other emergency that can't wait, call 9-1-1 or get the person to the nearest hospital. Otherwise, call your doctor.  Afterward, the reaction should be reported to the Vaccine Adverse Event Reporting System (VAERS). Your doctor might file this report, or you can do it yourself through the VAERS web site at www.vaers.hhs.gov, or by calling 1-800-822-7967. VAERS does not give medical advice.  6. The National Vaccine Injury Compensation Program The National Vaccine Injury Compensation Program (  VICP) is a federal program that was created to compensate people who may have been injured by certain vaccines. Persons who believe they may have been injured by a vaccine can learn about the program and about filing a claim by calling 1-800-338-2382 or visiting the VICP website at www.hrsa.gov/vaccinecompensation. There is a time limit to file a claim for compensation. 7. How can I learn more?  Ask your doctor. He or she can give you the vaccine package insert or suggest other sources of information.  Call your local or state health department.  Contact the Centers for Disease Control and Prevention (CDC):  Call 1-800-232-4636 (1-800-CDC-INFO) or  Visit CDC's website at www.cdc.gov/vaccines CDC Tdap Vaccine VIS (11/19/13)   This information is not intended to replace advice given to you by your health care  provider. Make sure you discuss any questions you have with your health care provider.   Document Released: 03/13/2012 Document Revised: 10/03/2014 Document Reviewed: 12/25/2013 Elsevier Interactive Patient Education 2016 [employer]  

## 2015-10-26 NOTE — Progress Notes (Signed)
Flu vaccine and tdap given and patient tolerated well.

## 2016-01-19 ENCOUNTER — Ambulatory Visit: Payer: BLUE CROSS/BLUE SHIELD | Admitting: Family Medicine

## 2016-01-20 ENCOUNTER — Encounter: Payer: Self-pay | Admitting: Family Medicine

## 2016-01-20 ENCOUNTER — Ambulatory Visit (INDEPENDENT_AMBULATORY_CARE_PROVIDER_SITE_OTHER): Payer: BLUE CROSS/BLUE SHIELD | Admitting: Family Medicine

## 2016-01-20 ENCOUNTER — Encounter (INDEPENDENT_AMBULATORY_CARE_PROVIDER_SITE_OTHER): Payer: Self-pay

## 2016-01-20 VITALS — BP 115/72 | HR 102 | Temp 98.7°F | Ht 67.0 in | Wt 150.2 lb

## 2016-01-20 DIAGNOSIS — J0100 Acute maxillary sinusitis, unspecified: Secondary | ICD-10-CM | POA: Diagnosis not present

## 2016-01-20 MED ORDER — BETAMETHASONE SOD PHOS & ACET 6 (3-3) MG/ML IJ SUSP
6.0000 mg | Freq: Once | INTRAMUSCULAR | Status: AC
  Administered 2016-01-20: 6 mg via INTRAMUSCULAR

## 2016-01-20 MED ORDER — SULFAMETHOXAZOLE-TRIMETHOPRIM 800-160 MG PO TABS: 1.0000 | ORAL_TABLET | Freq: Two times a day (BID) | ORAL | Status: DC

## 2016-01-20 MED ORDER — METOPROLOL SUCCINATE ER 25 MG PO TB24: 25.0000 mg | ORAL_TABLET | Freq: Every day | ORAL | Status: DC

## 2016-01-20 NOTE — Progress Notes (Signed)
Subjective:  Patient ID: Kevin Andrews, male    DOB: 1986/09/13  Age: 30 y.o. MRN: [id number]  CC: Hypertension and Nevus   HPI Kevin Andrews presents for Symptoms include congestion, facial pain, nasal congestion, no  fever, non productive cough, post nasal drip and sinus pressure with no fever, chills, night sweats or weight loss. Onset of symptoms was a few days ago, gradually worsening since that time. Pt.is drinking moderate amounts of fluids.     follow-up of hypertension. Patient has no history of headache chest pain or shortness of breath or recent cough. Patient also denies symptoms of TIA such as numbness weakness lateralizing. Patient checks  blood pressure at home and has not had any elevated readings recently. Patient denies side effects from his medication. States taking it regularly.   Patient also has several moles on his back that he wants checked. There's been no change recently but his wife's concern about potential for cancer because her mother died of cancer. Additionally the patient does tend to have excessive sun exposure from going without a shirt outside in the summertime.  History Kevin Andrews has no past medical history on file.   He has no past surgical history on file.   His family history is not on file.He reports that he has never smoked. He does not have any smokeless tobacco history on file. He reports that he does not drink alcohol or use illicit drugs.    ROS Review of Systems  Constitutional: Negative for fever, chills, diaphoresis, activity change and appetite change.  HENT: Positive for congestion, postnasal drip and sinus pressure. Negative for ear discharge, ear pain, hearing loss, nosebleeds, rhinorrhea, sneezing, sore throat and trouble swallowing.   Respiratory: Negative for cough, chest tightness and shortness of breath.   Cardiovascular: Negative for chest pain and palpitations.  Gastrointestinal: Negative for abdominal pain.    Musculoskeletal: Negative for myalgias and arthralgias.  Skin: Negative for rash.  Neurological: Negative for weakness and headaches.    Objective:  BP 115/72 mmHg  Pulse 102  Temp(Src) 98.7 F (37.1 C) (Oral)  Ht  (1.702 m)  Wt 150 lb 3.2 oz (68.13 kg)  BMI 23.52 kg/m2  SpO2 99%  BP Readings from Last 3 Encounters:  01/20/16 115/72  07/21/15 131/90  12/25/14 121/81    Wt Readings from Last 3 Encounters:  01/20/16 150 lb 3.2 oz (68.13 kg)  07/21/15 152 lb 6.4 oz (69.128 kg)  12/25/14 149 lb 12.8 oz (67.949 kg)     Physical Exam  Constitutional: He is oriented to person, place, and time. He appears well-developed and well-nourished. No distress.  HENT:  Head: Normocephalic and atraumatic.  Right Ear: External ear normal.  Left Ear: External ear normal.  Nose: Nose normal.  Mouth/Throat: Oropharynx is clear and moist.  Eyes: Conjunctivae and EOM are normal. Pupils are equal, round, and reactive to light.  Neck: Normal range of motion. Neck supple. No thyromegaly present.  Cardiovascular: Normal rate, regular rhythm and normal heart sounds.   No murmur heard. Pulmonary/Chest: Effort normal and breath sounds normal. No respiratory distress. He has no wheezes. He has no rales.  Abdominal: Soft. Bowel sounds are normal. He exhibits no distension. There is no tenderness.  Lymphadenopathy:    He has no cervical adenopathy.  Neurological: He is alert and oriented to person, place, and time. He has normal reflexes.  Skin: Skin is warm and dry.  Psychiatric: He has a normal mood and affect. His behavior is normal.  Judgment and thought content normal.     Lab Results  Component Value Date   WBC 5.9 07/21/2015   HGB 15.7 09/30/2014   HCT 45.1 07/21/2015   PLT 199 07/21/2015   GLUCOSE 86 07/21/2015   CHOL 154 07/21/2015   TRIG 73 07/21/2015   HDL 46 07/21/2015   LDLCALC 93 07/21/2015   ALT 12 07/21/2015   AST 17 07/21/2015   NA 140 07/21/2015   K 3.9  07/21/2015   CL 98 07/21/2015   CREATININE 1.01 07/21/2015   BUN 11 07/21/2015   CO2 25 07/21/2015    Ct Head (brain) Wo Contrast  09/30/2014  CLINICAL DATA:  Dizziness, hypertension. EXAM: CT HEAD WITHOUT CONTRAST TECHNIQUE: Contiguous axial images were obtained from the base of the skull through the vertex without intravenous contrast. COMPARISON:  08/04/2011 FINDINGS: No intracranial hemorrhage, mass effect, or midline shift. No hydrocephalus. The basilar cisterns are patent. No evidence of territorial infarct. No intracranial fluid collection. Calvarium is intact. These mucosal thickening involving the ethmoid air cells, right greater than left and right frontal sinus. The previous opacification of right mastoid air cells has resolved. IMPRESSION: 1. No acute intracranial abnormality. 2. Sinusitis. Electronically Signed   By: Rubye OaksMelanie  Ehinger M.D.   On: 09/30/2014 21:53    Assessment & Plan:   Kevin Andrews was seen today for hypertension and nevus.  Diagnoses and all orders for this visit:  Acute maxillary sinusitis, recurrence not specified -     betamethasone acetate-betamethasone sodium phosphate (CELESTONE) injection 6 mg; Inject 1 mL (6 mg total) into the muscle once.  Other orders -     metoprolol succinate (TOPROL-XL) 25 MG 24 hr tablet; Take 1 tablet (25 mg total) by mouth daily. -     sulfamethoxazole-trimethoprim (BACTRIM DS,SEPTRA DS) 800-160 MG tablet; Take 1 tablet by mouth 2 (two) times daily.     I have changed Kevin Andrews's metoprolol succinate. I am also having him start on sulfamethoxazole-trimethoprim. Additionally, I am having him maintain his pantoprazole. We will continue to administer betamethasone acetate-betamethasone sodium phosphate.  Meds ordered this encounter  Medications  . metoprolol succinate (TOPROL-XL) 25 MG 24 hr tablet    Sig: Take 1 tablet (25 mg total) by mouth daily.    Dispense:  30 tablet    Refill:  5  . sulfamethoxazole-trimethoprim  (BACTRIM DS,SEPTRA DS) 800-160 MG tablet    Sig: Take 1 tablet by mouth 2 (two) times daily.    Dispense:  14 tablet    Refill:  0  . betamethasone acetate-betamethasone sodium phosphate (CELESTONE) injection 6 mg    Sig:      Follow-up: No Follow-up on file.  Mechele ClaudeWarren Jennylee Uehara, M.D.

## 2016-01-20 NOTE — Addendum Note (Signed)
Addended by: Bearl MulberryUTHERFORD, NATALIE K on: 01/20/2016 05:13 PM   Modules accepted: Kipp BroodSmartSet

## 2016-07-22 ENCOUNTER — Ambulatory Visit: Payer: BLUE CROSS/BLUE SHIELD | Admitting: Family Medicine

## 2016-09-10 ENCOUNTER — Other Ambulatory Visit: Payer: Self-pay | Admitting: Family Medicine

## 2016-09-10 MED ORDER — METOPROLOL SUCCINATE ER 25 MG PO TB24: 25.0000 mg | ORAL_TABLET | Freq: Every day | ORAL | 0 refills | Status: DC

## 2016-09-10 NOTE — Telephone Encounter (Signed)
Left message that 30 day supply sent to pharmacy but he will need to keep his appt next week. He should call back and speak with on call provider about any concerns about symptoms he's having since being off of his meds for 1 week.

## 2016-09-14 ENCOUNTER — Ambulatory Visit: Payer: BLUE CROSS/BLUE SHIELD | Admitting: Family Medicine

## 2016-09-15 ENCOUNTER — Telehealth: Payer: Self-pay | Admitting: Family Medicine

## 2016-10-08 ENCOUNTER — Other Ambulatory Visit: Payer: Self-pay | Admitting: Family Medicine

## 2016-11-07 ENCOUNTER — Other Ambulatory Visit: Payer: Self-pay | Admitting: Family Medicine

## 2016-12-07 NOTE — Telephone Encounter (Signed)
Left message

## 2017-01-30 ENCOUNTER — Ambulatory Visit (INDEPENDENT_AMBULATORY_CARE_PROVIDER_SITE_OTHER): Payer: Self-pay | Admitting: Family Medicine

## 2017-01-30 ENCOUNTER — Encounter: Payer: Self-pay | Admitting: Family Medicine

## 2017-01-30 VITALS — BP 135/92 | HR 70 | Temp 98.4°F | Ht 67.0 in | Wt 153.0 lb

## 2017-01-30 DIAGNOSIS — N521 Erectile dysfunction due to diseases classified elsewhere: Secondary | ICD-10-CM

## 2017-01-30 DIAGNOSIS — I10 Essential (primary) hypertension: Secondary | ICD-10-CM

## 2017-01-30 MED ORDER — DOXAZOSIN MESYLATE 8 MG PO TABS
8.0000 mg | ORAL_TABLET | Freq: Every day | ORAL | 5 refills | Status: DC
Start: 2017-03-02 — End: 2018-06-28

## 2017-01-30 MED ORDER — DOXAZOSIN MESYLATE 4 MG PO TABS: 4.0000 mg | ORAL_TABLET | Freq: Every day | ORAL | 0 refills | Status: DC

## 2017-01-30 NOTE — Progress Notes (Signed)
Subjective:  Patient ID: Kevin Andrews, male    DOB: 21-Apr-1986  Age: 31 y.o. MRN: [id number]  CC: Hypertension (pt here today for routine follow up on his HTN)   HPI Kevin Andrews presents for  follow-up of hypertension. Patient is taking med regularly.Having occasional E.D. Only occurs when taking med. If he holds the med he can perform normally.   History Kevin Andrews has no past medical history on file.   He has no past surgical history on file.   His family history is not on file.He reports that he has never smoked. He has never used smokeless tobacco. He reports that he does not drink alcohol or use drugs.  No current outpatient prescriptions on file prior to visit.   No current facility-administered medications on file prior to visit.     ROS Review of Systems  Constitutional: Negative for activity change.  Cardiovascular: Negative for chest pain.  Genitourinary: Negative for testicular pain.  Neurological: Negative for dizziness.    Objective:  BP (!) 135/92   Pulse 70   Temp 98.4 F (36.9 C) (Oral)   Ht 5\' 7"  (1.702 m)   Wt 153 lb (69.4 kg)   BMI 23.96 kg/m   BP Readings from Last 3 Encounters:  01/30/17 (!) 135/92  01/20/16 115/72  07/21/15 131/90    Wt Readings from Last 3 Encounters:  01/30/17 153 lb (69.4 kg)  01/20/16 150 lb 3.2 oz (68.1 kg)  07/21/15 152 lb 6.4 oz (69.1 kg)     Physical Exam  Constitutional: He is oriented to person, place, and time. He appears well-developed and well-nourished.  HENT:  Head: Normocephalic and atraumatic.  Right Ear: External ear normal.  Left Ear: External ear normal.  Mouth/Throat: No oropharyngeal exudate or posterior oropharyngeal erythema.  Eyes: Pupils are equal, round, and reactive to light.  Neck: Normal range of motion. Neck supple.  Cardiovascular: Normal rate and regular rhythm.   No murmur heard. Pulmonary/Chest: Breath sounds normal. No respiratory distress.  Neurological: He is alert and  oriented to person, place, and time.  Vitals reviewed.    Lab Results  Component Value Date   WBC 5.9 07/21/2015   HGB 15.7 09/30/2014   HCT 45.1 07/21/2015   PLT 199 07/21/2015   GLUCOSE 86 07/21/2015   CHOL 154 07/21/2015   TRIG 73 07/21/2015   HDL 46 07/21/2015   LDLCALC 93 07/21/2015   ALT 12 07/21/2015   AST 17 07/21/2015   NA 140 07/21/2015   K 3.9 07/21/2015   CL 98 07/21/2015   CREATININE 1.01 07/21/2015   BUN 11 07/21/2015   CO2 25 07/21/2015    Ct Head (brain) Wo Contrast  Result Date: 09/30/2014 CLINICAL DATA:  Dizziness, hypertension. EXAM: CT HEAD WITHOUT CONTRAST TECHNIQUE: Contiguous axial images were obtained from the base of the skull through the vertex without intravenous contrast. COMPARISON:  08/04/2011 FINDINGS: No intracranial hemorrhage, mass effect, or midline shift. No hydrocephalus. The basilar cisterns are patent. No evidence of territorial infarct. No intracranial fluid collection. Calvarium is intact. These mucosal thickening involving the ethmoid air cells, right greater than left and right frontal sinus. The previous opacification of right mastoid air cells has resolved. IMPRESSION: 1. No acute intracranial abnormality. 2. Sinusitis. Electronically Signed   By: Rubye Oaks M.D.   On: 09/30/2014 21:53    Assessment & Plan:   Kevin Andrews was seen today for hypertension.  Diagnoses and all orders for this visit:  Essential hypertension, benign  Erectile  dysfunction due to diseases classified elsewhere  Other orders -     doxazosin (CARDURA) 4 MG tablet; Take 1 tablet (4 mg total) by mouth at bedtime. For blood pressure. -     doxazosin (CARDURA) 8 MG tablet; Take 1 tablet (8 mg total) by mouth at bedtime. For BP. (Starting One month after starting 4 mg dose)   I have discontinued Mr. Elsie StainMarshall's pantoprazole, sulfamethoxazole-trimethoprim, and metoprolol succinate. I am also having him start on doxazosin and doxazosin.  Meds ordered this  encounter  Medications  . doxazosin (CARDURA) 4 MG tablet    Sig: Take 1 tablet (4 mg total) by mouth at bedtime. For blood pressure.    Dispense:  30 tablet    Refill:  0  . doxazosin (CARDURA) 8 MG tablet    Sig: Take 1 tablet (8 mg total) by mouth at bedtime. For BP. (Starting One month after starting 4 mg dose)    Dispense:  30 tablet    Refill:  5      Follow-up: Return in about 6 months (around 08/02/2017) for Wellness.  Mechele ClaudeWarren Jalea Bronaugh, M.D.

## 2017-01-31 ENCOUNTER — Other Ambulatory Visit: Payer: Self-pay | Admitting: Family Medicine

## 2017-01-31 ENCOUNTER — Telehealth: Payer: Self-pay | Admitting: Family Medicine

## 2017-01-31 MED ORDER — SULFAMETHOXAZOLE-TRIMETHOPRIM 800-160 MG PO TABS: 1.0000 | ORAL_TABLET | Freq: Two times a day (BID) | ORAL | 0 refills | Status: DC

## 2017-02-27 ENCOUNTER — Other Ambulatory Visit: Payer: Self-pay | Admitting: Family Medicine

## 2018-04-22 DIAGNOSIS — R21 Rash and other nonspecific skin eruption: Secondary | ICD-10-CM | POA: Diagnosis not present

## 2018-04-22 DIAGNOSIS — W57XXXA Bitten or stung by nonvenomous insect and other nonvenomous arthropods, initial encounter: Secondary | ICD-10-CM | POA: Diagnosis not present

## 2018-06-28 ENCOUNTER — Encounter: Payer: Self-pay | Admitting: Family Medicine

## 2018-06-28 ENCOUNTER — Ambulatory Visit (INDEPENDENT_AMBULATORY_CARE_PROVIDER_SITE_OTHER): Payer: BLUE CROSS/BLUE SHIELD | Admitting: Family Medicine

## 2018-06-28 VITALS — BP 134/86 | HR 100 | Temp 97.8°F | Ht 67.0 in | Wt 169.0 lb

## 2018-06-28 DIAGNOSIS — I10 Essential (primary) hypertension: Secondary | ICD-10-CM

## 2018-06-28 MED ORDER — METOPROLOL SUCCINATE ER 25 MG PO TB24: 25.0000 mg | ORAL_TABLET | Freq: Every day | ORAL | 5 refills | Status: DC

## 2018-06-28 NOTE — Progress Notes (Signed)
Subjective:  Patient ID: Kevin Andrews, male    DOB: Jan 07, 1986  Age: 32 y.o. MRN: [id number]  CC: Hypertension (follow up )   HPI Kevin Andrews presents for  follow-up of hypertension. Patient has no history of headache chest pain or shortness of breath or recent cough. Patient also denies symptoms of TIA such as focal numbness or weakness. Has been off all meds. 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  He would like to go back on the metoprolol.  He says that worked best for him in the past.  History Kevin Andrews has no past medical history on file.   He has no past surgical history on file.   His family history is not on file.He reports that he has never smoked. He has never used smokeless tobacco. He reports that he does not drink alcohol or use drugs.  No current outpatient medications on file prior to visit.   No current facility-administered medications on file prior to visit.     ROS Review of Systems  Constitutional: Negative.   HENT: Negative.   Eyes: Negative for visual disturbance.  Respiratory: Negative for cough and shortness of breath.   Cardiovascular: Negative for chest pain and leg swelling.  Gastrointestinal: Negative for abdominal pain, diarrhea, nausea and vomiting.  Genitourinary: Negative for difficulty urinating.  Musculoskeletal: Negative for arthralgias and myalgias.  Skin: Negative for rash.  Neurological: Negative for headaches.  Psychiatric/Behavioral: Negative for sleep disturbance.    Objective:  BP 134/86   Pulse 100   Temp 97.8 F (36.6 C) (Oral)   Ht 5\' 7"  (1.702 m)   Wt 169 lb (76.7 kg)   BMI 26.47 kg/m   BP Readings from Last 3 Encounters:  06/28/18 134/86  01/30/17 (!) 135/92  01/20/16 115/72    Wt Readings from Last 3 Encounters:  06/28/18 169 lb (76.7 kg)  01/30/17 153 lb (69.4 kg)  01/20/16 150 lb 3.2 oz (68.1 kg)      Physical Exam  Constitutional: He is oriented to person, place, and time. He appears well-developed and well-nourished.  HENT:  Head: Normocephalic and atraumatic.  Right Ear: External ear normal.  Left Ear: External ear normal.  Mouth/Throat: No oropharyngeal exudate or posterior oropharyngeal erythema.  Eyes: Pupils are equal, round, and reactive to light.  Neck: Normal range of motion. Neck supple.  Cardiovascular: Normal rate and regular rhythm.  No murmur heard. Pulmonary/Chest: Breath sounds normal. No respiratory distress.  Neurological: He is alert and oriented to person, place, and time.  Vitals reviewed.     Assessment & Plan:   Kevin Andrews was seen today for hypertension.  Diagnoses and all orders for this visit:  Essential hypertension, benign  Other orders -     Discontinue: metoprolol succinate (TOPROL-XL) 25 MG 24 hr tablet; Take 1 tablet (25 mg total) by mouth daily. -     metoprolol succinate (TOPROL-XL) 25 MG 24 hr tablet; Take 1 tablet (25 mg total) by mouth daily.   Allergies as of 06/28/2018      Reactions   Penicillins Other (See Comments)   Reaction from childhood      Medication List        Accurate as of 06/28/18 11:59 PM. Always use your most recent med list.          metoprolol succinate 25 MG 24 hr tablet Commonly known as:  TOPROL-XL Take  1 tablet (25 mg total) by mouth daily.       Meds ordered this encounter  Medications  . DISCONTD: metoprolol succinate (TOPROL-XL) 25 MG 24 hr tablet    Sig: Take 1 tablet (25 mg total) by mouth daily.    Dispense:  30 tablet    Refill:  5  . metoprolol succinate (TOPROL-XL) 25 MG 24 hr tablet    Sig: Take 1 tablet (25 mg total) by mouth daily.    Dispense:  30 tablet    Refill:  5    Patient was advised to check his blood pressure regularly for the next several weeks and we can adjust his metoprolol upward as needed.  Follow-up: Return in about 3 months (around 09/28/2018).  Mechele Claude, M.D.

## 2018-07-03 ENCOUNTER — Encounter: Payer: Self-pay | Admitting: Family Medicine

## 2018-07-04 ENCOUNTER — Ambulatory Visit: Payer: BLUE CROSS/BLUE SHIELD | Admitting: *Deleted

## 2018-07-04 VITALS — BP 124/84 | HR 72

## 2018-07-04 DIAGNOSIS — Z0130 Encounter for examination of blood pressure without abnormal findings: Secondary | ICD-10-CM

## 2018-07-31 ENCOUNTER — Ambulatory Visit: Payer: BLUE CROSS/BLUE SHIELD | Admitting: Family Medicine

## 2018-08-08 ENCOUNTER — Ambulatory Visit: Payer: BLUE CROSS/BLUE SHIELD | Admitting: Family Medicine

## 2018-08-29 ENCOUNTER — Encounter: Payer: Self-pay | Admitting: Family Medicine

## 2018-08-29 ENCOUNTER — Ambulatory Visit (INDEPENDENT_AMBULATORY_CARE_PROVIDER_SITE_OTHER): Payer: BLUE CROSS/BLUE SHIELD | Admitting: Family Medicine

## 2018-08-29 VITALS — BP 132/88 | HR 67 | Temp 98.1°F | Ht 67.0 in | Wt 171.0 lb

## 2018-08-29 DIAGNOSIS — K219 Gastro-esophageal reflux disease without esophagitis: Secondary | ICD-10-CM

## 2018-08-29 DIAGNOSIS — I10 Essential (primary) hypertension: Secondary | ICD-10-CM | POA: Diagnosis not present

## 2018-08-29 MED ORDER — METOPROLOL SUCCINATE ER 25 MG PO TB24: 25.0000 mg | ORAL_TABLET | Freq: Every day | ORAL | 1 refills | Status: DC

## 2018-08-29 MED ORDER — PANTOPRAZOLE SODIUM 40 MG PO TBEC: 40.0000 mg | DELAYED_RELEASE_TABLET | Freq: Every day | ORAL | 1 refills | Status: DC

## 2018-08-29 NOTE — Progress Notes (Signed)
Subjective:  Patient ID: Kevin Andrews, male    DOB: 10-25-1985  Age: 32 y.o. MRN: [id number]  CC: Hypertension (one month follow up)   HPI Kevin LeschJustin Olenick presents for  follow-up of hypertension. Patient has no history of headache chest pain or shortness of breath or recent cough. Patient also denies symptoms of TIA such as focal numbness or weakness. Patient denies side effects from medication. States taking it regularly.  Specifically this medicine had caused erectile dysfunction couple of years ago.  He says that has not been a problem this time.   History Kevin Andrews has no past medical history on file.   He has no past surgical history on file.   His family history is not on file.He reports that he has never smoked. He has never used smokeless tobacco. He reports that he does not drink alcohol or use drugs.  No current outpatient medications on file prior to visit.   No current facility-administered medications on file prior to visit.     ROS Review of Systems  Constitutional: Negative for fever.  Respiratory: Negative for shortness of breath.   Cardiovascular: Negative for chest pain.  Gastrointestinal: Positive for abdominal distention and abdominal pain.  Musculoskeletal: Negative for arthralgias.  Skin: Negative for rash.    Objective:  BP 132/88   Pulse 67   Temp 98.1 F (36.7 C) (Oral)   Ht 5\' 7"  (1.702 m)   Wt 171 lb (77.6 kg)   BMI 26.78 kg/m   BP Readings from Last 3 Encounters:  08/29/18 132/88  07/04/18 124/84  06/28/18 134/86    Wt Readings from Last 3 Encounters:  08/29/18 171 lb (77.6 kg)  06/28/18 169 lb (76.7 kg)  01/30/17 153 lb (69.4 kg)     Physical Exam  Constitutional: He is oriented to person, place, and time. He appears well-developed and well-nourished.  HENT:  Head: Normocephalic and atraumatic.  Right Ear: External ear normal.  Left Ear: External ear normal.  Mouth/Throat: No oropharyngeal exudate or posterior oropharyngeal  erythema.  Eyes: Pupils are equal, round, and reactive to light.  Neck: Normal range of motion. Neck supple.  Cardiovascular: Normal rate and regular rhythm.  No murmur heard. Pulmonary/Chest: Breath sounds normal. No respiratory distress.  Abdominal: He exhibits no distension. There is no tenderness.  Neurological: He is alert and oriented to person, place, and time.  Vitals reviewed.     Assessment & Plan:   Kevin Andrews was seen today for hypertension.  Diagnoses and all orders for this visit:  Essential hypertension, benign  Gastroesophageal reflux disease without esophagitis  Other orders -     metoprolol succinate (TOPROL-XL) 25 MG 24 hr tablet; Take 1 tablet (25 mg total) by mouth daily. For reflux and bloating -     pantoprazole (PROTONIX) 40 MG tablet; Take 1 tablet (40 mg total) by mouth daily. For stomach   Allergies as of 08/29/2018      Reactions   Penicillins Other (See Comments)   Reaction from childhood      Medication List        Accurate as of 08/29/18  6:44 PM. Always use your most recent med list.          metoprolol succinate 25 MG 24 hr tablet Commonly known as:  TOPROL-XL Take 1 tablet (25 mg total) by mouth daily. For reflux and bloating   pantoprazole 40 MG tablet Commonly known as:  PROTONIX Take 1 tablet (40 mg total) by mouth daily. For stomach  Meds ordered this encounter  Medications  . metoprolol succinate (TOPROL-XL) 25 MG 24 hr tablet    Sig: Take 1 tablet (25 mg total) by mouth daily. For reflux and bloating    Dispense:  90 tablet    Refill:  1  . pantoprazole (PROTONIX) 40 MG tablet    Sig: Take 1 tablet (40 mg total) by mouth daily. For stomach    Dispense:  90 tablet    Refill:  1     Follow-up: Return in about 6 months (around 02/28/2019).  Mechele Claude, M.D.

## 2019-01-02 ENCOUNTER — Emergency Department (HOSPITAL_COMMUNITY)
Admission: EM | Admit: 2019-01-02 | Discharge: 2019-01-02 | Disposition: A | Discharge status: Home / Self Care | Payer: Medicaid Other | Attending: Emergency Medicine | Admitting: Emergency Medicine

## 2019-01-02 ENCOUNTER — Encounter (HOSPITAL_COMMUNITY): Payer: Self-pay

## 2019-01-02 ENCOUNTER — Other Ambulatory Visit: Payer: Self-pay

## 2019-01-02 ENCOUNTER — Emergency Department (HOSPITAL_COMMUNITY): Payer: Medicaid Other

## 2019-01-02 DIAGNOSIS — Y998 Other external cause status: Secondary | ICD-10-CM | POA: Diagnosis not present

## 2019-01-02 DIAGNOSIS — M25561 Pain in right knee: Secondary | ICD-10-CM | POA: Diagnosis not present

## 2019-01-02 DIAGNOSIS — Y92410 Unspecified street and highway as the place of occurrence of the external cause: Secondary | ICD-10-CM | POA: Insufficient documentation

## 2019-01-02 DIAGNOSIS — S060X9A Concussion with loss of consciousness of unspecified duration, initial encounter: Secondary | ICD-10-CM | POA: Insufficient documentation

## 2019-01-02 DIAGNOSIS — R109 Unspecified abdominal pain: Secondary | ICD-10-CM | POA: Insufficient documentation

## 2019-01-02 DIAGNOSIS — Y9389 Activity, other specified: Secondary | ICD-10-CM | POA: Diagnosis not present

## 2019-01-02 DIAGNOSIS — S0990XA Unspecified injury of head, initial encounter: Secondary | ICD-10-CM | POA: Diagnosis not present

## 2019-01-02 DIAGNOSIS — R52 Pain, unspecified: Secondary | ICD-10-CM | POA: Diagnosis not present

## 2019-01-02 DIAGNOSIS — I10 Essential (primary) hypertension: Secondary | ICD-10-CM | POA: Diagnosis not present

## 2019-01-02 DIAGNOSIS — M546 Pain in thoracic spine: Secondary | ICD-10-CM | POA: Diagnosis not present

## 2019-01-02 DIAGNOSIS — R609 Edema, unspecified: Secondary | ICD-10-CM | POA: Diagnosis not present

## 2019-01-02 MED ORDER — OXYCODONE HCL 5 MG PO TABS
5.0000 mg | ORAL_TABLET | Freq: Once | ORAL | Status: AC
Start: 2019-01-02 — End: 2019-01-02
  Administered 2019-01-02: 5 mg via ORAL
  Filled 2019-01-02: qty 1

## 2019-01-02 MED ORDER — ACETAMINOPHEN 500 MG PO TABS
1000.0000 mg | ORAL_TABLET | Freq: Once | ORAL | Status: AC
  Administered 2019-01-02: 22:00:00 1000 mg via ORAL
  Filled 2019-01-02: qty 2
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
## 2019-01-02 NOTE — Discharge Instructions (Signed)
Take 4 over the counter ibuprofen tablets 3 times a day or 2 over-the-counter naproxen tablets twice a day for pain. Also take tylenol 1000mg(2 extra strength) four times a day.    

## 2019-01-02 NOTE — ED Notes (Signed)
Bed: [facility]2 Expected date:  Expected time:  Means of arrival:  Comments: 

## 2019-01-07 ENCOUNTER — Ambulatory Visit (INDEPENDENT_AMBULATORY_CARE_PROVIDER_SITE_OTHER): Payer: BLUE CROSS/BLUE SHIELD | Admitting: Nurse Practitioner

## 2019-01-07 ENCOUNTER — Encounter: Payer: Self-pay | Admitting: Nurse Practitioner

## 2019-01-07 ENCOUNTER — Other Ambulatory Visit: Payer: Self-pay

## 2019-01-07 DIAGNOSIS — G44319 Acute post-traumatic headache, not intractable: Secondary | ICD-10-CM | POA: Diagnosis not present

## 2019-01-07 DIAGNOSIS — F410 Panic disorder [episodic paroxysmal anxiety] without agoraphobia: Secondary | ICD-10-CM

## 2019-01-07 MED ORDER — ESCITALOPRAM OXALATE 10 MG PO TABS: 10.0000 mg | ORAL_TABLET | Freq: Every day | ORAL | 5 refills | Status: DC

## 2019-01-07 MED ORDER — BUSPIRONE HCL 15 MG PO TABS: 15.0000 mg | ORAL_TABLET | Freq: Three times a day (TID) | ORAL | 0 refills | Status: DC
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
## 2019-03-01 ENCOUNTER — Ambulatory Visit: Payer: BLUE CROSS/BLUE SHIELD | Admitting: Family Medicine

## 2019-07-10 ENCOUNTER — Other Ambulatory Visit: Payer: Self-pay | Admitting: Family Medicine

## 2019-07-24 ENCOUNTER — Other Ambulatory Visit: Payer: Self-pay | Admitting: Family Medicine

## 2019-08-27 DIAGNOSIS — Z20828 Contact with and (suspected) exposure to other viral communicable diseases: Secondary | ICD-10-CM | POA: Diagnosis not present

## 2019-09-10 ENCOUNTER — Other Ambulatory Visit: Payer: Self-pay | Admitting: Family Medicine

## 2019-09-10 NOTE — Telephone Encounter (Signed)
Stacks. NTBS 30 days given 07/10/19

## 2019-10-12 ENCOUNTER — Other Ambulatory Visit: Payer: Self-pay | Admitting: Family Medicine

## 2019-10-14 NOTE — Telephone Encounter (Signed)
Stacks. NTBS 30 days given 08/18/19

## 2019-10-14 NOTE — Telephone Encounter (Signed)
Lmtcb to schedule appt-cb 1/18

## 2019-11-28 DIAGNOSIS — Z20822 Contact with and (suspected) exposure to covid-19: Secondary | ICD-10-CM | POA: Diagnosis not present

## 2019-12-03 DIAGNOSIS — Z20822 Contact with and (suspected) exposure to covid-19: Secondary | ICD-10-CM | POA: Diagnosis not present
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
## 2020-01-08 DIAGNOSIS — J069 Acute upper respiratory infection, unspecified: Secondary | ICD-10-CM | POA: Diagnosis not present

## 2020-01-08 DIAGNOSIS — Z20822 Contact with and (suspected) exposure to covid-19: Secondary | ICD-10-CM | POA: Diagnosis not present

## 2020-02-27 ENCOUNTER — Ambulatory Visit (INDEPENDENT_AMBULATORY_CARE_PROVIDER_SITE_OTHER): Payer: BC Managed Care – PPO | Admitting: Family Medicine

## 2020-02-27 ENCOUNTER — Encounter: Payer: Self-pay | Admitting: Family Medicine

## 2020-02-27 ENCOUNTER — Other Ambulatory Visit: Payer: Self-pay

## 2020-02-27 VITALS — BP 134/89 | HR 61 | Temp 97.6°F | Ht 67.0 in | Wt 155.8 lb

## 2020-02-27 DIAGNOSIS — I10 Essential (primary) hypertension: Secondary | ICD-10-CM

## 2020-02-27 DIAGNOSIS — K219 Gastro-esophageal reflux disease without esophagitis: Secondary | ICD-10-CM | POA: Diagnosis not present

## 2020-02-27 MED ORDER — PANTOPRAZOLE SODIUM 40 MG PO TBEC: 40.0000 mg | DELAYED_RELEASE_TABLET | Freq: Every day | ORAL | 1 refills | Status: DC

## 2020-02-27 MED ORDER — METOPROLOL SUCCINATE ER 25 MG PO TB24
25.0000 mg | ORAL_TABLET | Freq: Every day | ORAL | 3 refills | Status: DC
Start: 2020-02-27 — End: 2022-02-02

## 2020-02-27 NOTE — Progress Notes (Signed)
Subjective:  Patient ID: Kevin Andrews, male    DOB: 1986-01-06  Age: 34 y.o. MRN: [id number]  CC: Medication Refill   HPI Kevin Andrews presents for  follow-up of hypertension. Patient has no history of headache chest pain or shortness of breath or recent cough. Patient also denies symptoms of TIA such as focal numbness or weakness. Patient denies side effects from medication. States taking it regularly. .Patient in for follow-up of GERD. Currently asymptomatic taking  PPI or TUMS prn. There is no chest pain or heartburn. No hematemesis and no melena. No dysphagia or choking. Onset is remote. Progression is stable. Complicating factors, none.   History Kevin Andrews has no past medical history on file.   He has no past surgical history on file.   His family history is not on file.He reports that he has never smoked. He has never used smokeless tobacco. He reports that he does not drink alcohol or use drugs.  No current outpatient medications on file prior to visit.   No current facility-administered medications on file prior to visit.    ROS Review of Systems  Constitutional: Negative.   HENT: Negative.   Eyes: Negative for visual disturbance.  Respiratory: Negative for cough and shortness of breath.   Cardiovascular: Negative for chest pain and leg swelling.  Gastrointestinal: Negative for abdominal pain, diarrhea, nausea and vomiting.  Genitourinary: Negative for difficulty urinating.  Musculoskeletal: Negative for arthralgias and myalgias.  Skin: Negative for rash.  Neurological: Negative for headaches.  Psychiatric/Behavioral: Negative for sleep disturbance.    Objective:  BP 134/89   Pulse 61   Temp 97.6 F (36.4 C) (Temporal)   Ht '5\' 7"'  (1.702 m)   Wt 155 lb 12.8 oz (70.7 kg)   BMI 24.40 kg/m   BP Readings from Last 3 Encounters:  02/27/20 134/89  01/02/19 (!) 132/91  08/29/18 132/88    Wt Readings from Last 3 Encounters:  02/27/20 155 lb 12.8 oz (70.7 kg)   08/29/18 171 lb (77.6 kg)  06/28/18 169 lb (76.7 kg)     Physical Exam Constitutional:      General: He is not in acute distress.    Appearance: He is well-developed.  HENT:     Head: Normocephalic and atraumatic.     Right Ear: External ear normal.     Left Ear: External ear normal.     Nose: Nose normal.  Eyes:     Conjunctiva/sclera: Conjunctivae normal.     Pupils: Pupils are equal, round, and reactive to light.  Cardiovascular:     Rate and Rhythm: Normal rate and regular rhythm.     Heart sounds: Normal heart sounds. No murmur.  Pulmonary:     Effort: Pulmonary effort is normal. No respiratory distress.     Breath sounds: Normal breath sounds. No wheezing or rales.  Abdominal:     Palpations: Abdomen is soft.     Tenderness: There is no abdominal tenderness.  Musculoskeletal:        General: Normal range of motion.     Cervical back: Normal range of motion and neck supple.  Skin:    General: Skin is warm and dry.  Neurological:     Mental Status: He is alert and oriented to person, place, and time.     Deep Tendon Reflexes: Reflexes are normal and symmetric.  Psychiatric:        Behavior: Behavior normal.        Thought Content: Thought content normal.  Judgment: Judgment normal.       Assessment & Plan:   Kevin Andrews was seen today for medication refill.  Diagnoses and all orders for this visit:  Essential hypertension, benign -     CBC with Differential/Platelet -     CMP14+EGFR -     Lipid panel  Gastroesophageal reflux disease without esophagitis -     CBC with Differential/Platelet -     CMP14+EGFR  Other orders -     metoprolol succinate (TOPROL-XL) 25 MG 24 hr tablet; Take 1 tablet (25 mg total) by mouth daily. (Needs to be seen before next refill) -     pantoprazole (PROTONIX) 40 MG tablet; Take 1 tablet (40 mg total) by mouth daily. For stomach   Allergies as of 02/27/2020      Reactions   Penicillins Other (See Comments)   Reaction  from childhood      Medication List       Accurate as of February 27, 2020 10:21 AM. If you have any questions, ask your nurse or doctor.        STOP taking these medications   busPIRone 15 MG tablet Commonly known as: BUSPAR Stopped by: Claretta Fraise, MD   escitalopram 10 MG tablet Commonly known as: Lexapro Stopped by: Claretta Fraise, MD     TAKE these medications   metoprolol succinate 25 MG 24 hr tablet Commonly known as: TOPROL-XL Take 1 tablet (25 mg total) by mouth daily. (Needs to be seen before next refill)   pantoprazole 40 MG tablet Commonly known as: PROTONIX Take 1 tablet (40 mg total) by mouth daily. For stomach       Meds ordered this encounter  Medications  . metoprolol succinate (TOPROL-XL) 25 MG 24 hr tablet    Sig: Take 1 tablet (25 mg total) by mouth daily. (Needs to be seen before next refill)    Dispense:  90 tablet    Refill:  3  . pantoprazole (PROTONIX) 40 MG tablet    Sig: Take 1 tablet (40 mg total) by mouth daily. For stomach    Dispense:  90 tablet    Refill:  1    Place on hold. Pt. Will call when needed. Thanks    Nml check up. SCreen ing labs ordered  Follow-up: Return in about 1 year (around 02/26/2021).  Claretta Fraise, M.D.

## 2020-02-28 LAB — CBC WITH DIFFERENTIAL/PLATELET
Basophils Absolute: 0 10*3/uL (ref 0.0–0.2)
Basos: 0 %
EOS (ABSOLUTE): 0.2 10*3/uL (ref 0.0–0.4)
Eos: 3 %
Hematocrit: 47.6 % (ref 37.5–51.0)
Hemoglobin: 15.7 g/dL (ref 13.0–17.7)
Immature Grans (Abs): 0 10*3/uL (ref 0.0–0.1)
Immature Granulocytes: 0 %
Lymphocytes Absolute: 1.5 10*3/uL (ref 0.7–3.1)
Lymphs: 28 %
MCH: 28.2 pg (ref 26.6–33.0)
MCHC: 33 g/dL (ref 31.5–35.7)
MCV: 86 fL (ref 79–97)
Monocytes Absolute: 0.4 10*3/uL (ref 0.1–0.9)
Monocytes: 8 %
Neutrophils Absolute: 3.2 10*3/uL (ref 1.4–7.0)
Neutrophils: 61 %
Platelets: 203 10*3/uL (ref 150–450)
RBC: 5.56 x10E6/uL (ref 4.14–5.80)
RDW: 12.3 % (ref 11.6–15.4)
WBC: 5.4 10*3/uL (ref 3.4–10.8)

## 2020-02-28 LAB — CMP14+EGFR
ALT: 11 IU/L (ref 0–44)
AST: 15 IU/L (ref 0–40)
Albumin/Globulin Ratio: 1.7 (ref 1.2–2.2)
Albumin: 4.6 g/dL (ref 4.0–5.0)
Alkaline Phosphatase: 110 IU/L (ref 48–121)
BUN/Creatinine Ratio: 12 (ref 9–20)
BUN: 13 mg/dL (ref 6–20)
Bilirubin Total: 0.7 mg/dL (ref 0.0–1.2)
CO2: 23 mmol/L (ref 20–29)
Calcium: 9.8 mg/dL (ref 8.7–10.2)
Chloride: 101 mmol/L (ref 96–106)
Creatinine, Ser: 1.12 mg/dL (ref 0.76–1.27)
GFR calc Af Amer: 99 mL/min/{1.73_m2} (ref >59)
GFR calc non Af Amer: 86 mL/min/{1.73_m2} (ref >59)
Globulin, Total: 2.7 g/dL (ref 1.5–4.5)
Glucose: 103 mg/dL — ABNORMAL HIGH (ref 65–99)
Potassium: 4.4 mmol/L (ref 3.5–5.2)
Sodium: 142 mmol/L (ref 134–144)
Total Protein: 7.3 g/dL (ref 6.0–8.5)

## 2020-02-28 LAB — LIPID PANEL
Chol/HDL Ratio: 4.1 ratio (ref 0.0–5.0)
Cholesterol, Total: 172 mg/dL (ref 100–199)
HDL: 42 mg/dL (ref >39)
LDL Chol Calc (NIH): 117 mg/dL — ABNORMAL HIGH (ref 0–99)
Triglycerides: 69 mg/dL (ref 0–149)
VLDL Cholesterol Cal: 13 mg/dL (ref 5–40)

## 2020-02-28 NOTE — Progress Notes (Signed)
Hello Jhamal,  Your lab result is normal and/or stable.Some minor variations that are not significant are commonly marked abnormal, but do not represent any medical problem for you.  Best regards, Jordy Verba, M.D.

## 2020-05-08 DIAGNOSIS — Z20822 Contact with and (suspected) exposure to covid-19: Secondary | ICD-10-CM | POA: Diagnosis not present

## 2021-01-12 ENCOUNTER — Encounter: Payer: Self-pay | Admitting: *Deleted

## 2021-02-25 ENCOUNTER — Ambulatory Visit: Payer: BC Managed Care – PPO | Admitting: Family Medicine

## 2021-03-11 ENCOUNTER — Ambulatory Visit: Payer: BC Managed Care – PPO | Admitting: Family Medicine

## 2021-03-11 ENCOUNTER — Encounter: Payer: Self-pay | Admitting: Family Medicine

## 2021-04-26 DIAGNOSIS — Z20822 Contact with and (suspected) exposure to covid-19: Secondary | ICD-10-CM | POA: Diagnosis not present

## 2021-06-10 ENCOUNTER — Encounter: Payer: BC Managed Care – PPO | Admitting: Family Medicine

## 2021-11-10 DIAGNOSIS — Z03818 Encounter for observation for suspected exposure to other biological agents ruled out: Secondary | ICD-10-CM | POA: Diagnosis not present

## 2021-11-10 DIAGNOSIS — J029 Acute pharyngitis, unspecified: Secondary | ICD-10-CM | POA: Diagnosis not present

## 2022-01-24 DIAGNOSIS — J029 Acute pharyngitis, unspecified: Secondary | ICD-10-CM | POA: Diagnosis not present

## 2022-02-02 ENCOUNTER — Telehealth: Payer: Self-pay | Admitting: Family Medicine

## 2022-02-02 ENCOUNTER — Other Ambulatory Visit: Payer: Self-pay | Admitting: *Deleted

## 2022-02-02 MED ORDER — METOPROLOL SUCCINATE ER 25 MG PO TB24: 25.0000 mg | ORAL_TABLET | Freq: Every day | ORAL | 0 refills | Status: DC

## 2022-02-02 NOTE — Telephone Encounter (Signed)
?  Prescription Request ? ?02/02/2022 ? ?Is this a "Controlled Substance" medicine? NO ? ?Have you seen your PCP in the last 2 weeks? NO pt has appt on 03/03/22 with PCP needs refill  ? ?If YES, route message to pool  -  If NO, patient needs to be scheduled for appointment. ? ?What is the name of the medication or equipment? metoprolol succinate (TOPROL-XL) 25 MG 24 hr tablet ? ?Have you contacted your pharmacy to request a refill? yes  ? ?Which pharmacy would you like this sent to? [facility] [place] ? ? ?Patient notified that their request is being sent to the clinical staff for review and that they should receive a response within 2 business days.  ? ? ?

## 2022-03-03 ENCOUNTER — Ambulatory Visit (INDEPENDENT_AMBULATORY_CARE_PROVIDER_SITE_OTHER): Payer: BC Managed Care – PPO | Admitting: Family Medicine

## 2022-03-03 ENCOUNTER — Encounter: Payer: Self-pay | Admitting: Family Medicine

## 2022-03-03 VITALS — BP 145/100 | HR 67 | Temp 97.5°F | Ht 67.0 in | Wt 160.8 lb

## 2022-03-03 DIAGNOSIS — Z13220 Encounter for screening for lipoid disorders: Secondary | ICD-10-CM

## 2022-03-03 DIAGNOSIS — K219 Gastro-esophageal reflux disease without esophagitis: Secondary | ICD-10-CM

## 2022-03-03 DIAGNOSIS — I10 Essential (primary) hypertension: Secondary | ICD-10-CM

## 2022-03-03 MED ORDER — PANTOPRAZOLE SODIUM 40 MG PO TBEC: 40.0000 mg | DELAYED_RELEASE_TABLET | Freq: Every day | ORAL | 1 refills | Status: AC

## 2022-03-03 MED ORDER — TOBRAMYCIN-DEXAMETHASONE 0.3-0.1 % OP SUSP: OPHTHALMIC | 0 refills | Status: AC

## 2022-03-03 MED ORDER — METOPROLOL SUCCINATE ER 25 MG PO TB24: 25.0000 mg | ORAL_TABLET | Freq: Every day | ORAL | 3 refills | Status: AC

## 2022-03-03 NOTE — Addendum Note (Signed)
Addended by: Claretta Fraise on: 03/03/2022 11:41 AM   Modules accepted: Orders

## 2022-03-03 NOTE — Progress Notes (Addendum)
Subjective:  Patient ID: Kevin Andrews, male    DOB: 09-30-1985  Age: 36 y.o. MRN: [id number]  CC: Hypertension   HPI Kevin Andrews presents for  follow-up of hypertension. Patient has no history of headache chest pain or shortness of breath or recent cough. Patient also denies symptoms of TIA such as focal numbness or weakness. Patient denies side effects from medication. States taking it regularly until he ran out several weeks ago. BP has gone up since then. Has a new child born 67 mos early, requiring extra care.  GERD flaring recently after GI virus. Having dyspepsia and belching with certain foods.   History Kevin Andrews has no past medical history on file.   He has no past surgical history on file.   His family history is not on file.He reports that he has never smoked. He has never used smokeless tobacco. He reports that he does not drink alcohol and does not use drugs.  No current outpatient medications on file prior to visit.   No current facility-administered medications on file prior to visit.    ROS Review of Systems  Constitutional:  Negative for fever.  Respiratory:  Negative for shortness of breath.   Cardiovascular:  Negative for chest pain.  Musculoskeletal:  Negative for arthralgias.  Skin:  Negative for rash.    Objective:  BP (!) 145/100   Pulse 67   Temp (!) 97.5 F (36.4 C)   Ht 5' 7" (1.702 m)   Wt 160 lb 12.8 oz (72.9 kg)   SpO2 99%   BMI 25.18 kg/m   BP Readings from Last 3 Encounters:  03/03/22 (!) 145/100  02/27/20 134/89  01/02/19 (!) 132/91    Wt Readings from Last 3 Encounters:  03/03/22 160 lb 12.8 oz (72.9 kg)  02/27/20 155 lb 12.8 oz (70.7 kg)  08/29/18 171 lb (77.6 kg)     Physical Exam Constitutional:      General: He is not in acute distress.    Appearance: He is well-developed.  HENT:     Head: Normocephalic and atraumatic.     Right Ear: External ear normal.     Left Ear: External ear normal.     Nose: Nose normal.   Eyes:     Conjunctiva/sclera: Conjunctivae normal.     Pupils: Pupils are equal, round, and reactive to light.  Cardiovascular:     Rate and Rhythm: Normal rate and regular rhythm.     Heart sounds: Normal heart sounds. No murmur heard. Pulmonary:     Effort: Pulmonary effort is normal. No respiratory distress.     Breath sounds: Normal breath sounds. No wheezing or rales.  Abdominal:     Palpations: Abdomen is soft.     Tenderness: There is no abdominal tenderness.  Musculoskeletal:        General: Normal range of motion.     Cervical back: Normal range of motion and neck supple.  Skin:    General: Skin is warm and dry.  Neurological:     Mental Status: He is alert and oriented to person, place, and time.     Deep Tendon Reflexes: Reflexes are normal and symmetric.  Psychiatric:        Behavior: Behavior normal.        Thought Content: Thought content normal.        Judgment: Judgment normal.       Assessment & Plan:   Kevin Andrews was seen today for hypertension.  Diagnoses and all orders  for this visit:  Essential hypertension, benign -     CBC with Differential/Platelet -     CMP14+EGFR  Gastroesophageal reflux disease without esophagitis  Other orders -     metoprolol succinate (TOPROL-XL) 25 MG 24 hr tablet; Take 1 tablet (25 mg total) by mouth daily. -     pantoprazole (PROTONIX) 40 MG tablet; Take 1 tablet (40 mg total) by mouth daily. For stomach -     tobramycin-dexamethasone (TOBRADEX) ophthalmic solution; Apply 1 drop in affected eye(s) every 2 hours for two days. Then every 4 hours for 5 days.   Allergies as of 03/03/2022       Reactions   Penicillins Other (See Comments)   Reaction from childhood        Medication List        Accurate as of March 03, 2022 11:41 AM. If you have any questions, ask your nurse or doctor.          metoprolol succinate 25 MG 24 hr tablet Commonly known as: TOPROL-XL Take 1 tablet (25 mg total) by mouth daily. What  changed: additional instructions Changed by: Claretta Fraise, MD   pantoprazole 40 MG tablet Commonly known as: PROTONIX Take 1 tablet (40 mg total) by mouth daily. For stomach   tobramycin-dexamethasone ophthalmic solution Commonly known as: TobraDex Apply 1 drop in affected eye(s) every 2 hours for two days. Then every 4 hours for 5 days. Started by: Claretta Fraise, MD        Meds ordered this encounter  Medications   metoprolol succinate (TOPROL-XL) 25 MG 24 hr tablet    Sig: Take 1 tablet (25 mg total) by mouth daily.    Dispense:  90 tablet    Refill:  3   pantoprazole (PROTONIX) 40 MG tablet    Sig: Take 1 tablet (40 mg total) by mouth daily. For stomach    Dispense:  90 tablet    Refill:  1   tobramycin-dexamethasone (TOBRADEX) ophthalmic solution    Sig: Apply 1 drop in affected eye(s) every 2 hours for two days. Then every 4 hours for 5 days.    Dispense:  5 mL    Refill:  0      Follow-up: Return in about 1 year (around 03/04/2023).  Claretta Fraise, M.D.

## 2022-03-04 LAB — CBC WITH DIFFERENTIAL/PLATELET
Basophils Absolute: 0 10*3/uL (ref 0.0–0.2)
Basos: 1 %
EOS (ABSOLUTE): 0.2 10*3/uL (ref 0.0–0.4)
Eos: 3 %
Hematocrit: 47.3 % (ref 37.5–51.0)
Hemoglobin: 15.8 g/dL (ref 13.0–17.7)
Immature Grans (Abs): 0 10*3/uL (ref 0.0–0.1)
Immature Granulocytes: 1 %
Lymphocytes Absolute: 1.4 10*3/uL (ref 0.7–3.1)
Lymphs: 22 %
MCH: 28.1 pg (ref 26.6–33.0)
MCHC: 33.4 g/dL (ref 31.5–35.7)
MCV: 84 fL (ref 79–97)
Monocytes Absolute: 0.4 10*3/uL (ref 0.1–0.9)
Monocytes: 5 %
Neutrophils Absolute: 4.4 10*3/uL (ref 1.4–7.0)
Neutrophils: 68 %
Platelets: 215 10*3/uL (ref 150–450)
RBC: 5.63 x10E6/uL (ref 4.14–5.80)
RDW: 12.9 % (ref 11.6–15.4)
WBC: 6.4 10*3/uL (ref 3.4–10.8)

## 2022-03-04 LAB — CMP14+EGFR
ALT: 14 IU/L (ref 0–44)
AST: 14 IU/L (ref 0–40)
Albumin/Globulin Ratio: 1.6 (ref 1.2–2.2)
Albumin: 4.5 g/dL (ref 4.0–5.0)
Alkaline Phosphatase: 123 IU/L — ABNORMAL HIGH (ref 44–121)
BUN/Creatinine Ratio: 11 (ref 9–20)
BUN: 12 mg/dL (ref 6–20)
Bilirubin Total: 0.4 mg/dL (ref 0.0–1.2)
CO2: 23 mmol/L (ref 20–29)
Calcium: 9.3 mg/dL (ref 8.7–10.2)
Chloride: 104 mmol/L (ref 96–106)
Creatinine, Ser: 1.07 mg/dL (ref 0.76–1.27)
Globulin, Total: 2.9 g/dL (ref 1.5–4.5)
Glucose: 110 mg/dL — ABNORMAL HIGH (ref 70–99)
Potassium: 4.1 mmol/L (ref 3.5–5.2)
Sodium: 142 mmol/L (ref 134–144)
Total Protein: 7.4 g/dL (ref 6.0–8.5)
eGFR: 93 mL/min/{1.73_m2} (ref >59)

## 2022-03-07 NOTE — Progress Notes (Signed)
Hello Nichalos,  Your lab result is normal and/or stable.Some minor variations that are not significant are commonly marked abnormal, but do not represent any medical problem for you.  Best regards, Nadiyah Zeis, M.D.
